# Patient Record
Sex: Male | Born: 1945
Health system: Southern US, Community
[De-identification: ages and names within clinical notes are randomized; demographics above are authoritative.]

## PROBLEM LIST (undated history)

## (undated) DIAGNOSIS — M8560 Other cyst of bone, unspecified site: Secondary | ICD-10-CM

## (undated) DIAGNOSIS — T8859XA Other complications of anesthesia, initial encounter: Secondary | ICD-10-CM

## (undated) DIAGNOSIS — T4145XA Adverse effect of unspecified anesthetic, initial encounter: Secondary | ICD-10-CM

## (undated) DIAGNOSIS — Z9889 Other specified postprocedural states: Secondary | ICD-10-CM

## (undated) DIAGNOSIS — I219 Acute myocardial infarction, unspecified: Secondary | ICD-10-CM

## (undated) DIAGNOSIS — I209 Angina pectoris, unspecified: Secondary | ICD-10-CM

## (undated) DIAGNOSIS — R112 Nausea with vomiting, unspecified: Secondary | ICD-10-CM

## (undated) DIAGNOSIS — I639 Cerebral infarction, unspecified: Secondary | ICD-10-CM

## (undated) HISTORY — DX: Cerebral infarction, unspecified: I63.9

## (undated) HISTORY — PX: APPENDECTOMY: SHX54

## (undated) HISTORY — PX: TONSILLECTOMY: SUR1361

## (undated) HISTORY — DX: Acute myocardial infarction, unspecified: I21.9

## (undated) HISTORY — PX: CARDIAC CATHETERIZATION: SHX172

## (undated) HISTORY — PX: HERNIA REPAIR: SHX51

---
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
## 2006-05-12 ENCOUNTER — Emergency Department (HOSPITAL_COMMUNITY): Admission: EM | Admit: 2006-05-12 | Discharge: 2006-05-12 | Payer: Self-pay | Admitting: Emergency Medicine

## 2007-10-21 ENCOUNTER — Emergency Department (HOSPITAL_COMMUNITY): Admission: EM | Admit: 2007-10-21 | Discharge: 2007-10-22 | Payer: Self-pay | Admitting: Emergency Medicine

## 2007-10-24 ENCOUNTER — Emergency Department (HOSPITAL_COMMUNITY): Admission: EM | Admit: 2007-10-24 | Discharge: 2007-10-24 | Payer: Self-pay | Admitting: Emergency Medicine

## 2008-05-05 ENCOUNTER — Ambulatory Visit (HOSPITAL_BASED_OUTPATIENT_CLINIC_OR_DEPARTMENT_OTHER): Admission: RE | Admit: 2008-05-05 | Discharge: 2008-05-05 | Payer: Self-pay | Admitting: Urology

## 2008-10-11 ENCOUNTER — Emergency Department (HOSPITAL_COMMUNITY): Admission: EM | Admit: 2008-10-11 | Discharge: 2008-10-11 | Payer: Self-pay | Admitting: Emergency Medicine

## 2009-05-25 IMAGING — CR DG CHEST 2V
2 series · 2 of 2 positions shown · non-contrast
Comparison: 10/21/2007

CLINICAL DATA: Chest pain

CHEST - 2 VIEW

[w chest pa]
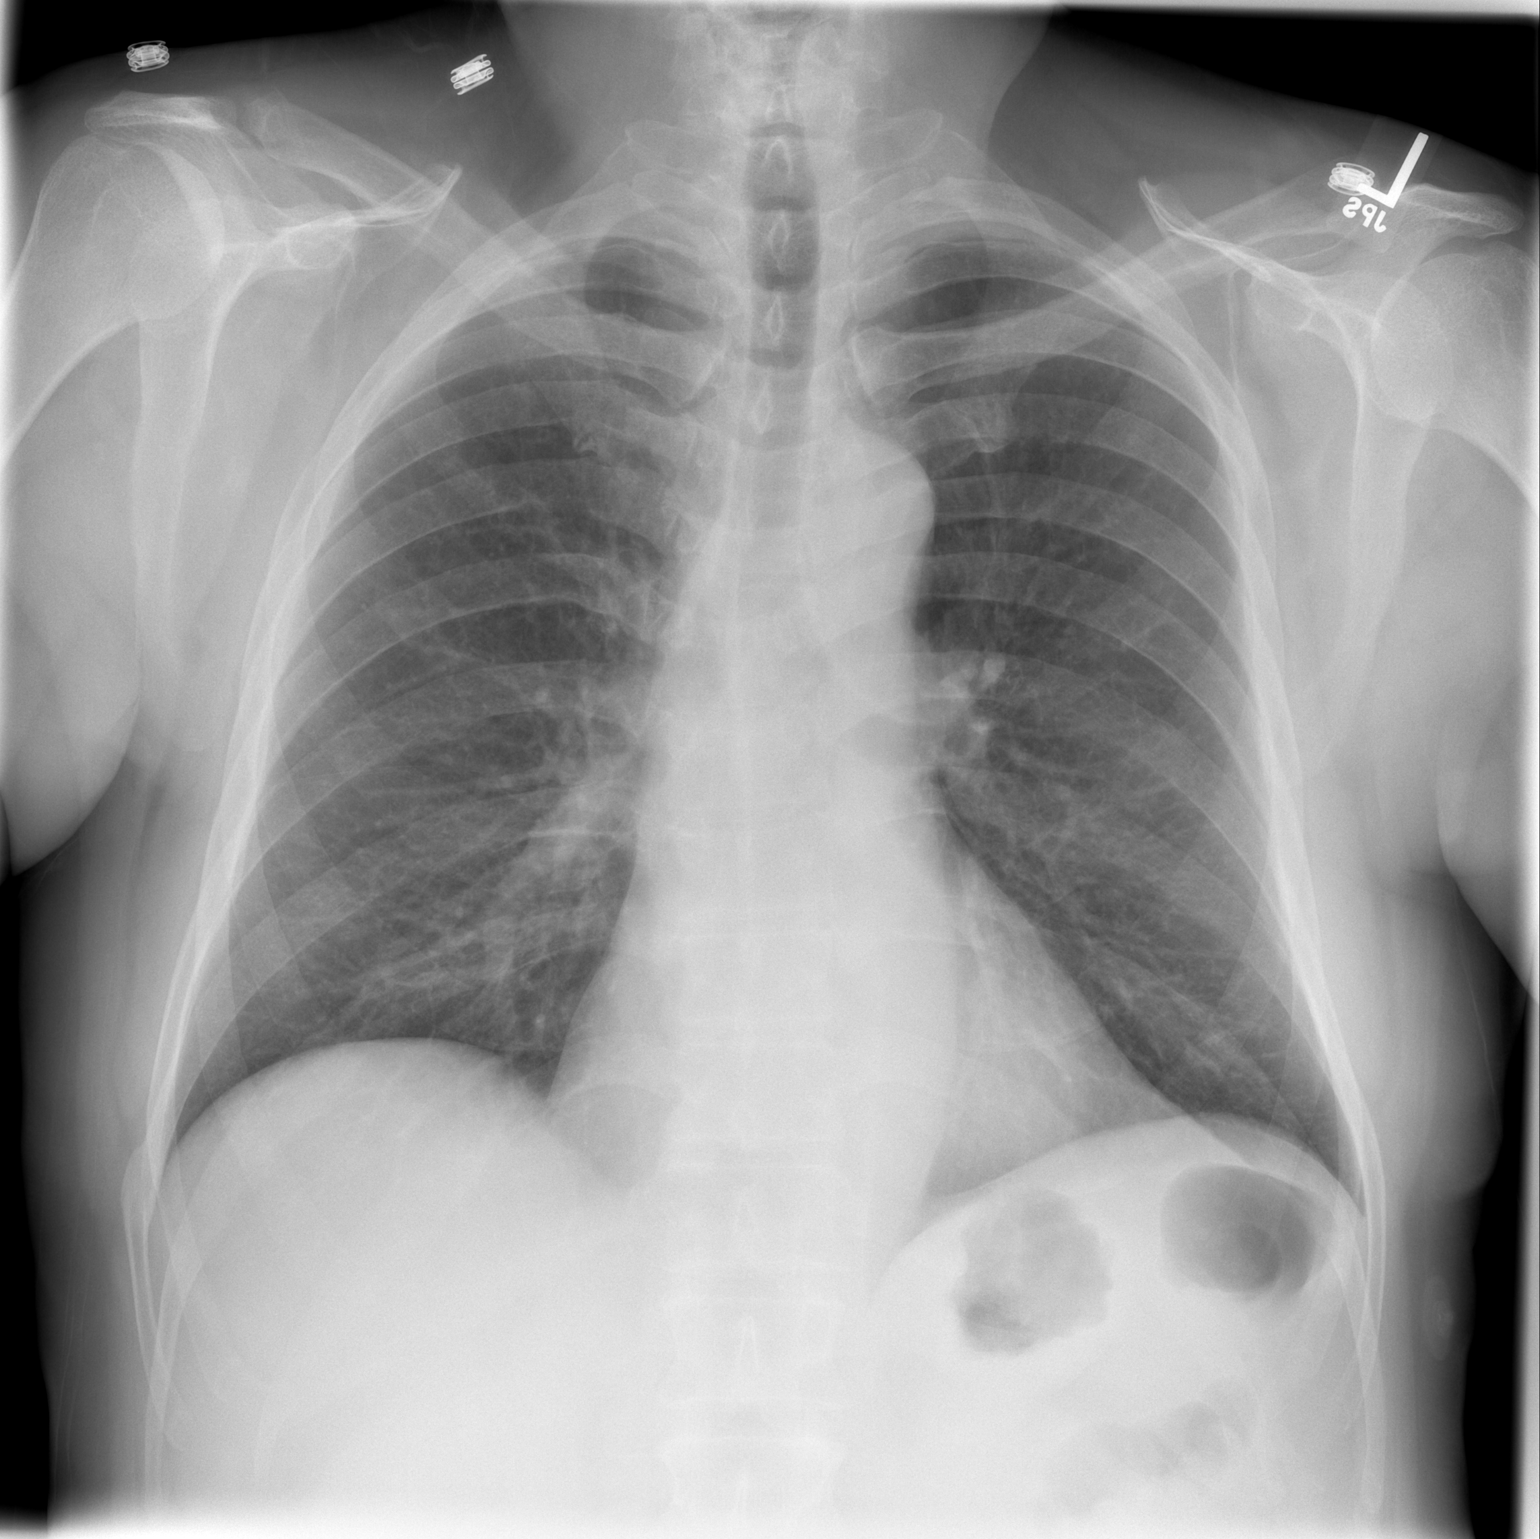

[w chest lat]
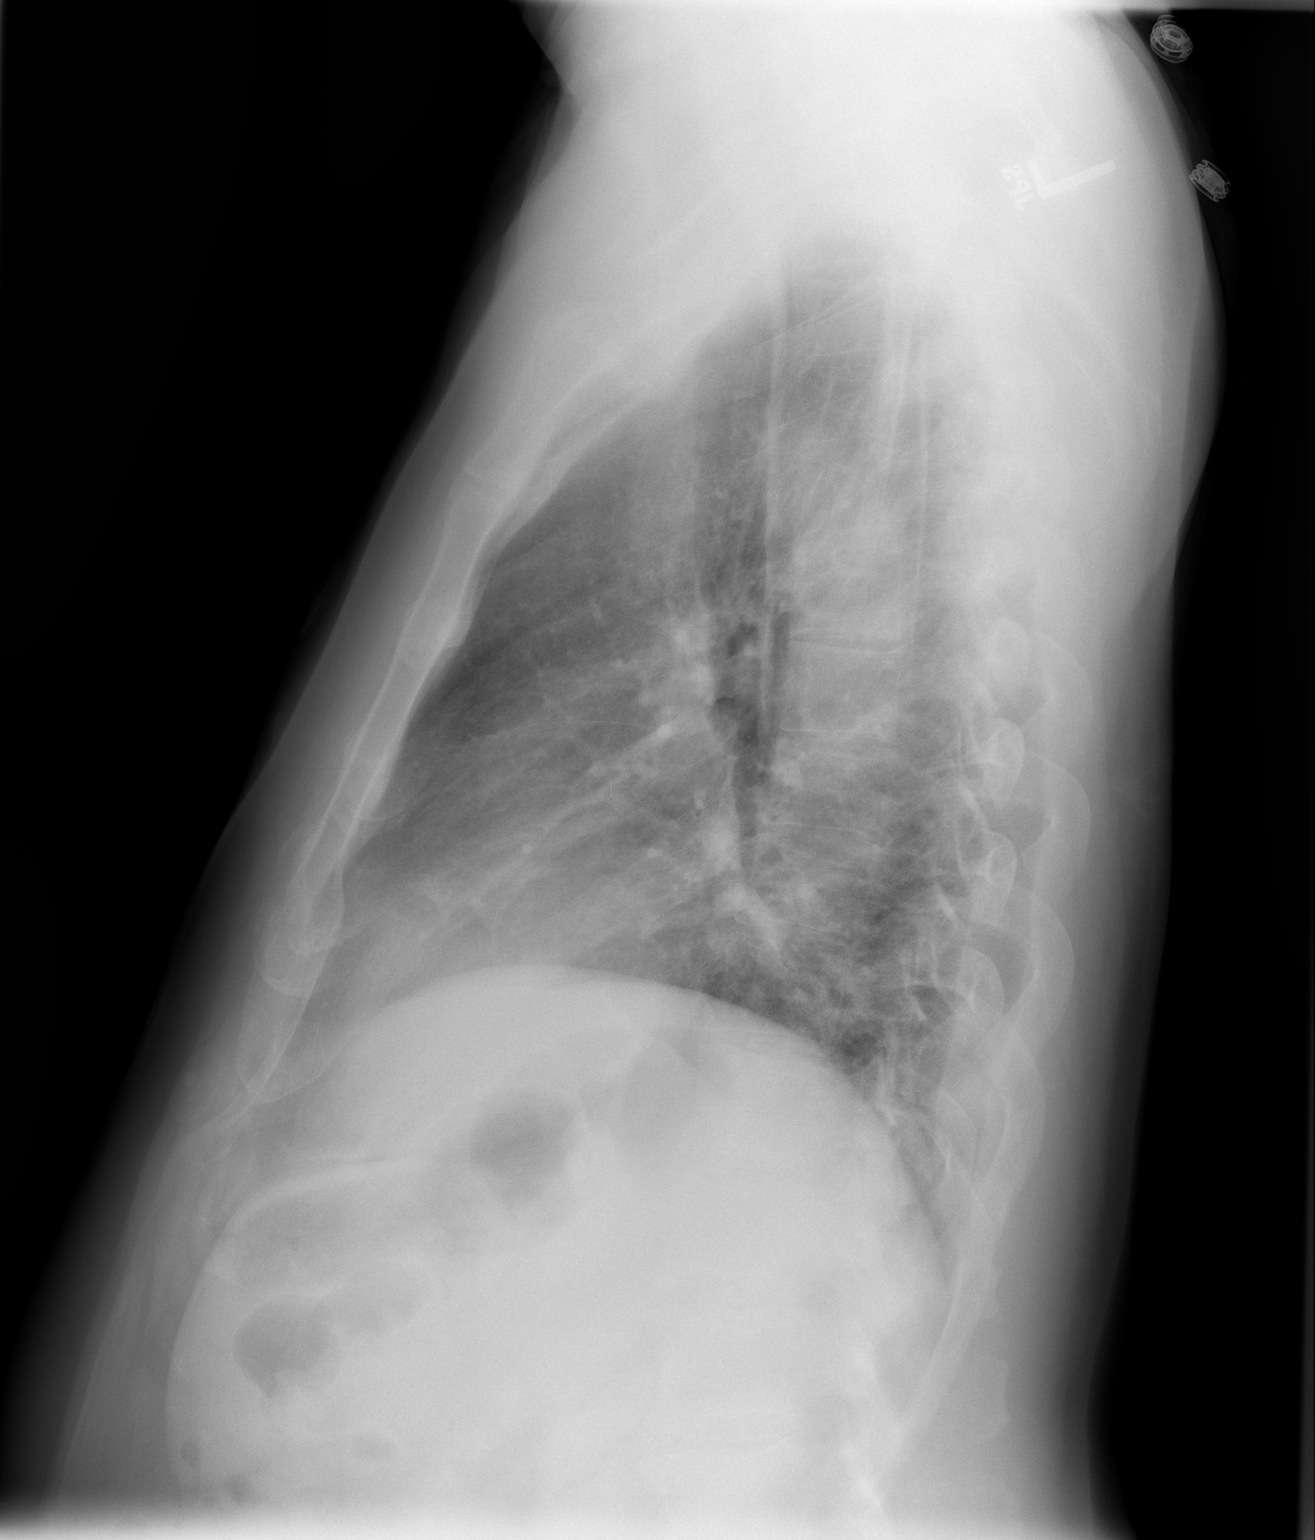

[2 of 2 positions shown; findings below may reference images not displayed]

FINDINGS: The heart size and mediastinal contours are within normal
limits.  Both lungs are clear.  Minimal chronic accentuation of
interstitial lung markings
IMPRESSION: No active disease.

## 2010-09-05 LAB — DIFFERENTIAL
Basophils Absolute: 0 10*3/uL (ref 0.0–0.1)
Basophils Relative: 0 % (ref 0–1)
Eosinophils Absolute: 0.2 10*3/uL (ref 0.0–0.7)
Monocytes Relative: 9 % (ref 3–12)
Neutro Abs: 3.8 10*3/uL (ref 1.7–7.7)
Neutrophils Relative %: 52 % (ref 43–77)

## 2010-09-05 LAB — POCT I-STAT, CHEM 8
Chloride: 103 mEq/L (ref 96–112)
Creatinine, Ser: 0.8 mg/dL (ref 0.4–1.5)
Glucose, Bld: 103 mg/dL — ABNORMAL HIGH (ref 70–99)
Sodium: 138 mEq/L (ref 135–145)

## 2010-09-05 LAB — POCT CARDIAC MARKERS
CKMB, poc: <1 ng/mL — ABNORMAL LOW (ref 1.0–8.0)
CKMB, poc: <1 ng/mL — ABNORMAL LOW (ref 1.0–8.0)
Myoglobin, poc: 30.6 ng/mL (ref 12–200)
Myoglobin, poc: 46.7 ng/mL (ref 12–200)
Troponin i, poc: <0.05 ng/mL (ref 0.00–0.09)
Troponin i, poc: <0.05 ng/mL (ref 0.00–0.09)

## 2010-09-05 LAB — CBC
MCHC: 35.3 g/dL (ref 30.0–36.0)
Platelets: 250 10*3/uL (ref 150–400)

## 2010-10-10 NOTE — Op Note (Signed)
NAMEBHAVYA, GRAND NO.:  [id number]   MEDICAL RECORD NO.:  [id number]           PATIENT TYPE:   LOCATION:                                 FACILITY:   PHYSICIAN:  Maretta Bees. Vonita Moss, M.D.DATE OF BIRTH:  Oct 06, 1945   DATE OF PROCEDURE:  05/05/2008  DATE OF DISCHARGE:                               OPERATIVE REPORT   PREOPERATIVE DIAGNOSIS:  Left hydrocele.   POSTOPERATIVE DIAGNOSIS:  Left hydrocele.   PROCEDURE:  Left hydrocelectomy.   SURGEON:  Larey Dresser, MD   ANESTHESIA:  General.   INDICATIONS:  This gentleman has had a long history of a slowly  enlarging left scrotal mass felt on exam to be a hydrocele and the  patient felt it was so big and uncomfortable it was time he wished it to  be removed.  After obtaining medical clearance he is brought to the OR  today.  He has been off his Plavix.   PROCEDURE IN DETAIL:  The patient was brought to the operating room and  placed in the supine position.  The lower abdomen and external genitalia  were prepped and draped in the usual fashion.  A transverse incision was  made in the left scrotum and carried down to the hydrocele which was  opened and drained of almost 500 mL of classic clear light yellow fluid.  Testicle was brought in the operative field and excess hydrocele sac  excised and bleeders fulgurated with the electrode.  Chromic catgut 3-0  was used to sew the remaining edge of the hydrocele behind the testicle  to prevent recurrence.  The cut edge of the hydrocele sac was oversewn  for hemostasis with 3-0 chromic catgut.  A few small bleeders were  electrocoagulated.  At this point there was good hemostasis and minimal  blood loss and this testicle was placed back in the scrotum.  The  scrotal wall closed in two layers of running 3-0 chromic catgut.  Some 5  mL of Marcaine was injected for local analgesia postoperatively.  Wound  was cleaned, dressed with Neosporin and dry sterile gauze.  He was  taken  to the recovery room in good condition having tolerated the procedure  well.      Maretta Bees. Vonita Moss, M.D.  Electronically Signed     LJP/MEDQ  D:  05/05/2008  T:  05/05/2008  Job:  [id number]   cc:   C. Duane Lope, M.D.  Fax: [phone]

## 2011-02-21 LAB — DIFFERENTIAL
Basophils Relative: 1
Monocytes Relative: 8
Neutro Abs: 8.4 — ABNORMAL HIGH
Neutrophils Relative %: 70

## 2011-02-21 LAB — POCT CARDIAC MARKERS
CKMB, poc: <1 — ABNORMAL LOW
CKMB, poc: <1 — ABNORMAL LOW
CKMB, poc: <1 — ABNORMAL LOW
Operator id: 229371
Operator id: 290111
Operator id: 290111
Troponin i, poc: <0.05
Troponin i, poc: <0.05

## 2011-02-21 LAB — COMPREHENSIVE METABOLIC PANEL
Albumin: 4.1
Alkaline Phosphatase: 99
BUN: 9
CO2: 23
Calcium: 9.5
GFR calc non Af Amer: >60
Total Bilirubin: 0.9

## 2011-02-21 LAB — CBC
Hemoglobin: 16.1
MCHC: 35.1
MCV: 93.2
Platelets: 280
WBC: 12.1 — ABNORMAL HIGH

## 2011-02-21 LAB — CK TOTAL AND CKMB (NOT AT ARMC): Relative Index: INVALID

## 2011-02-21 LAB — PROTIME-INR
INR: 0.9
Prothrombin Time: 12.3

## 2011-03-02 LAB — POCT HEMOGLOBIN-HEMACUE: Hemoglobin: 16.6 g/dL (ref 13.0–17.0)

## 2015-11-16 ENCOUNTER — Ambulatory Visit
Admission: RE | Admit: 2015-11-16 | Discharge: 2015-11-16 | Disposition: A | Discharge status: Home / Self Care | Payer: Medicare Other | Source: Ambulatory Visit | Attending: Family Medicine | Admitting: Family Medicine

## 2015-11-16 ENCOUNTER — Other Ambulatory Visit: Payer: Self-pay | Admitting: Family Medicine

## 2015-11-16 DIAGNOSIS — M79671 Pain in right foot: Secondary | ICD-10-CM

## 2015-12-02 ENCOUNTER — Encounter: Payer: Self-pay | Admitting: Podiatry

## 2015-12-02 ENCOUNTER — Ambulatory Visit (INDEPENDENT_AMBULATORY_CARE_PROVIDER_SITE_OTHER): Payer: Medicare Other

## 2015-12-02 ENCOUNTER — Ambulatory Visit (INDEPENDENT_AMBULATORY_CARE_PROVIDER_SITE_OTHER): Payer: Medicare Other | Admitting: Podiatry

## 2015-12-02 VITALS — BP 136/84 | HR 69 | Resp 12

## 2015-12-02 DIAGNOSIS — M722 Plantar fascial fibromatosis: Secondary | ICD-10-CM

## 2015-12-02 DIAGNOSIS — M79671 Pain in right foot: Secondary | ICD-10-CM

## 2015-12-02 MED ORDER — TRIAMCINOLONE ACETONIDE 10 MG/ML IJ SUSP
10.0000 mg | Freq: Once | INTRAMUSCULAR | Status: AC
  Administered 2015-12-02: 10 mg

## 2015-12-02 NOTE — Patient Instructions (Signed)
Plantar Fasciitis Plantar fasciitis is a painful foot condition that affects the heel. It occurs when the band of tissue that connects the toes to the heel bone (plantar fascia) becomes irritated. This can happen after exercising too much or doing other repetitive activities (overuse injury). The pain from plantar fasciitis can range from mild irritation to severe pain that makes it difficult for you to walk or move. The pain is usually worse in the morning or after you have been sitting or lying down for a while. CAUSES This condition may be caused by:  Standing for long periods of time.  Wearing shoes that do not fit.  Doing high-impact activities, including running, aerobics, and ballet.  Being overweight.  Having an abnormal way of walking (gait).  Having tight calf muscles.  Having high arches in your feet.  Starting a new athletic activity. SYMPTOMS The main symptom of this condition is heel pain. Other symptoms include:  Pain that gets worse after activity or exercise.  Pain that is worse in the morning or after resting.  Pain that goes away after you walk for a few minutes. DIAGNOSIS This condition may be diagnosed based on your signs and symptoms. Your health care provider will also do a physical exam to check for:  A tender area on the bottom of your foot.  A high arch in your foot.  Pain when you move your foot.  Difficulty moving your foot. You may also need to have imaging studies to confirm the diagnosis. These can include:  X-rays.  Ultrasound.  MRI. TREATMENT  Treatment for plantar fasciitis depends on the severity of the condition. Your treatment may include:  Rest, ice, and over-the-counter pain medicines to manage your pain.  Exercises to stretch your calves and your plantar fascia.  A splint that holds your foot in a stretched, upward position while you sleep (night splint).  Physical therapy to relieve symptoms and prevent problems in the  future.  Cortisone injections to relieve severe pain.  Extracorporeal shock wave therapy (ESWT) to stimulate damaged plantar fascia with electrical impulses. It is often used as a last resort before surgery.  Surgery, if other treatments have not worked after 12 months. HOME CARE INSTRUCTIONS  Take medicines only as directed by your health care provider.  Avoid activities that cause pain.  Roll the bottom of your foot over a bag of ice or a bottle of cold water. Do this for 20 minutes, 3-4 times a day.  Perform simple stretches as directed by your health care provider.  Try wearing athletic shoes with air-sole or gel-sole cushions or soft shoe inserts.  Wear a night splint while sleeping, if directed by your health care provider.  Keep all follow-up appointments with your health care provider. PREVENTION   Do not perform exercises or activities that cause heel pain.  Consider finding low-impact activities if you continue to have problems.  Lose weight if you need to. The best way to prevent plantar fasciitis is to avoid the activities that aggravate your plantar fascia. SEEK MEDICAL CARE IF:  Your symptoms do not go away after treatment with home care measures.  Your pain gets worse.  Your pain affects your ability to move or do your daily activities.   This information is not intended to replace advice given to you by your health care provider. Make sure you discuss any questions you have with your health care provider.   Document Released: 02/06/2001 Document Revised: 02/02/2015 Document Reviewed: 03/24/2014 Elsevier   Interactive Patient Education 2016 [employer] Inc.  

## 2015-12-02 NOTE — Progress Notes (Signed)
   Subjective:    Patient ID: Jerome Duncan, male    DOB: [birth year], 70 y.o.   MRN: [id number]  HPI  Chief Complaint  Patient presents with  . Plantar Fasciitis    RT FOOT BOTTOM OF THE HEEL IS BEEN HURTING FOR ABOUT 3 MONTHS. FOOT IS WORSE THE END OF THE PAIN.  TRIED ELEVATED/ NSAID -NO RELIEF   ALSO, CHECK 1ST JOINT IS PAINFUL ON THE LT FOOT.  Review of Systems  Musculoskeletal: Positive for gait problem.  All other systems reviewed and are negative.      Objective:   Physical Exam        Assessment & Plan:

## 2015-12-04 NOTE — Progress Notes (Signed)
Subjective:     Patient ID: Jerome Duncan, male   DOB: June 17, 1945, 70 y.o.   MRN: [id number]  HPI patient presents with a lot of pain in the bottom of his right heel and states that it's been going on now for about 3 months. Does not remember injury   Review of Systems  All other systems reviewed and are negative.      Objective:   Physical Exam  Constitutional: He is oriented to person, place, and time.  Cardiovascular: Intact distal pulses.   Musculoskeletal: Normal range of motion.  Neurological: He is oriented to person, place, and time.  Skin: Skin is warm.  Nursing note and vitals reviewed.  neurovascular status intact muscle strength adequate range of motion within normal limits with patient noted to have exquisite discomfort plantar aspect right heel at the insertional point tendon into the calcaneus with inflammation and fluid around the medial band and moderate depression of the arch. Patient's found have good digital perfusion and is well oriented 3     Assessment:     Inflammatory fasciitis right heel at the insertional point tendon into the calcaneus    Plan:     H&P x-rays reviewed condition discussed. Injected the right plantar fascia 3 mg Kenalog 5 mg Xylocaine and applied fascial brace instructed on physical therapy and shoe gear support and reappoint in 2 weeks  X-ray indicates spur formation with no indication of stress fracture or arthritis

## 2015-12-16 ENCOUNTER — Ambulatory Visit (INDEPENDENT_AMBULATORY_CARE_PROVIDER_SITE_OTHER): Payer: Medicare Other | Admitting: Podiatry

## 2015-12-16 ENCOUNTER — Encounter: Payer: Self-pay | Admitting: Podiatry

## 2015-12-16 DIAGNOSIS — M779 Enthesopathy, unspecified: Secondary | ICD-10-CM

## 2015-12-16 DIAGNOSIS — M722 Plantar fascial fibromatosis: Secondary | ICD-10-CM | POA: Diagnosis not present

## 2015-12-21 NOTE — Progress Notes (Signed)
Subjective:     Patient ID: Jerome Duncan, male   DOB: May 18, 1946, 70 y.o.   MRN: [id number]  HPI patient states that the heel has improved quite a bit with mild discomfort with palpation   Review of Systems     Objective:   Physical Exam Neurovascular status intact muscle strength adequate range of motion within normal limits with patient noted to have improved right hallux with inflammation fluid buildup that has reduced quite significantly    Assessment:     Improved plantar fasciitis right with pain still present upon deep palpation    Plan:     Advised on physical therapy anti-inflammatories and continued supportive therapy. Reappoint as needed

## 2018-10-01 ENCOUNTER — Ambulatory Visit: Payer: Medicare Other | Admitting: Podiatry

## 2018-10-01 ENCOUNTER — Other Ambulatory Visit: Payer: Self-pay

## 2018-10-01 ENCOUNTER — Other Ambulatory Visit: Payer: Self-pay | Admitting: Podiatry

## 2018-10-01 ENCOUNTER — Ambulatory Visit (INDEPENDENT_AMBULATORY_CARE_PROVIDER_SITE_OTHER): Payer: Medicare Other

## 2018-10-01 ENCOUNTER — Encounter: Payer: Self-pay | Admitting: Podiatry

## 2018-10-01 VITALS — Temp 97.5°F

## 2018-10-01 DIAGNOSIS — M79672 Pain in left foot: Secondary | ICD-10-CM

## 2018-10-01 DIAGNOSIS — M722 Plantar fascial fibromatosis: Secondary | ICD-10-CM

## 2018-10-01 MED ORDER — TRIAMCINOLONE ACETONIDE 10 MG/ML IJ SUSP
10.0000 mg | Freq: Once | INTRAMUSCULAR | Status: AC
  Administered 2018-10-01: 10 mg

## 2018-10-01 NOTE — Patient Instructions (Signed)

## 2018-10-01 NOTE — Progress Notes (Signed)
Subjective:   Patient ID: Jerome Duncan, male   DOB: 73 y.o.   MRN: [id number]   HPI Patient has started to develop a lot of pain in the bottom of his left heel and states that it started over the last several weeks and worse over the last 2 weeks.  Does not remember specific injury but has been active and patient does not currently smoke   ROS      Objective:  Physical Exam  Neurovascular status intact with patient found to have exquisite discomfort left plantar heel at the insertional point of the tendon into the calcaneus with inflammation fluid around the medial band     Assessment:  Acute plantar fasciitis left inflammation fluid around the medial band     Plan:  H&P x-ray reviewed and sterile prep applied and injected the left plantar fascia 3 mg Kenalog 5 mg Xylocaine applied fascial brace with instructions on usage and reappoint to recheck  X-rays indicate spur with no indication stress fracture arthritis

## 2018-10-15 ENCOUNTER — Encounter: Payer: Self-pay | Admitting: Podiatry

## 2018-10-15 ENCOUNTER — Other Ambulatory Visit: Payer: Self-pay

## 2018-10-15 ENCOUNTER — Ambulatory Visit: Payer: Medicare Other | Admitting: Podiatry

## 2018-10-15 VITALS — Temp 97.7°F

## 2018-10-15 DIAGNOSIS — M722 Plantar fascial fibromatosis: Secondary | ICD-10-CM

## 2018-10-15 MED ORDER — TRIAMCINOLONE ACETONIDE 10 MG/ML IJ SUSP
10.0000 mg | Freq: Once | INTRAMUSCULAR | Status: AC
  Administered 2018-10-15: 10 mg

## 2018-10-15 NOTE — Progress Notes (Signed)
Subjective:   Patient ID: Jerome Duncan, male   DOB: 73 y.o.   MRN: [id number]   HPI Patient states still developing pain but improved from previously with discomfort still if I walk on it for an extended period of time   ROS      Objective:  Physical Exam  Neurovascular status intact with inflammation pain around the plantar heel left at the insertional point tendon calcaneus     Assessment:  Plantar fasciitis present left still at the insertional point calcaneus with moderate improvement     Plan:  Reviewed the importance of exercises reviewed the importance of continued shoe gear modification and sterile prep and reinjected the fascia at insertion 3 mg Kenalog 5 mg Xylocaine and advised if symptoms recur will be seen back again

## 2019-06-26 ENCOUNTER — Ambulatory Visit: Payer: Medicare Other

## 2019-07-01 ENCOUNTER — Ambulatory Visit: Payer: Medicare Other

## 2019-07-04 ENCOUNTER — Ambulatory Visit: Payer: Medicare Other | Attending: Internal Medicine

## 2019-07-04 DIAGNOSIS — Z23 Encounter for immunization: Secondary | ICD-10-CM

## 2019-07-04 NOTE — Progress Notes (Signed)
   Covid-19 Vaccination Clinic  Name:  Jerome Duncan    MRN: [id number] DOB: 1946/03/05  07/04/2019  Mr. Jerome Duncan was observed post Covid-19 immunization for 15 minutes without incidence. He was provided with Vaccine Information Sheet and instruction to access the V-Safe system.   Mr. Jerome Duncan was instructed to call 911 with any severe reactions post vaccine: Marland Kitchen Difficulty breathing  . Swelling of your face and throat  . A fast heartbeat  . A bad rash all over your body  . Dizziness and weakness    Immunizations Administered    Name Date Dose VIS Date Route   Pfizer COVID-19 Vaccine 07/04/2019  4:01 PM 0.3 mL 05/08/2019 Intramuscular   Manufacturer: [place]   Lot: [id number]   [place]: [id number]

## 2019-07-29 ENCOUNTER — Ambulatory Visit: Payer: Medicare Other | Attending: Internal Medicine

## 2019-07-29 DIAGNOSIS — Z23 Encounter for immunization: Secondary | ICD-10-CM

## 2019-07-29 NOTE — Progress Notes (Signed)
   Covid-19 Vaccination Clinic  Name:  Jerome Duncan    MRN: [id number] DOB: 1945/11/20  07/29/2019  [name] was observed post Covid-19 immunization for 15 minutes without incident. He was provided with Vaccine Information Sheet and instruction to access the V-Safe system.   [name] was instructed to call 911 with any severe reactions post vaccine: Marland Kitchen Difficulty breathing  . Swelling of face and throat  . A fast heartbeat  . A bad rash all over body  . Dizziness and weakness   Immunizations Administered    Name Date Dose VIS Date Route   Pfizer COVID-19 Vaccine 07/29/2019 12:04 PM 0.3 mL 05/08/2019 Intramuscular   Manufacturer: [place]   Lot: [id number]   [place]: [id number]

## 2019-09-25 ENCOUNTER — Other Ambulatory Visit (HOSPITAL_COMMUNITY)
Admission: RE | Admit: 2019-09-25 | Discharge: 2019-09-25 | Disposition: A | Discharge status: Home / Self Care | Payer: Medicare Other | Source: Ambulatory Visit | Attending: Orthopedic Surgery | Admitting: Orthopedic Surgery

## 2019-09-25 DIAGNOSIS — Z01812 Encounter for preprocedural laboratory examination: Secondary | ICD-10-CM | POA: Insufficient documentation

## 2019-09-25 DIAGNOSIS — Z20822 Contact with and (suspected) exposure to covid-19: Secondary | ICD-10-CM | POA: Insufficient documentation

## 2019-09-25 LAB — SARS CORONAVIRUS 2 (TAT 6-24 HRS): SARS Coronavirus 2: NEGATIVE

## 2019-09-25 NOTE — Patient Instructions (Addendum)
DUE TO COVID-19 ONLY ONE VISITOR IS ALLOWED TO COME WITH YOU AND STAY IN THE WAITING ROOM ONLY DURING PRE OP AND PROCEDURE DAY OF SURGERY. THE 2 VISITORS MAY VISIT WITH YOU AFTER SURGERY IN YOUR PRIVATE ROOM DURING VISITING HOURS ONLY!   ONCE YOUR COVID TEST IS COMPLETED, PLEASE BEGIN THE QUARANTINE INSTRUCTIONS AS OUTLINED IN YOUR HANDOUT.                Jerome [facility]    Your procedure is scheduled on: 09/29/19   Report to [facility] Main  Entrance   Report to Short Stay at 5:30 AM     Call this number if you have problems the morning of surgery Jerome Duncan, NO CHEWING GUM [place].  Do not eat food After Midnight.   YOU MAY HAVE CLEAR LIQUIDS FROM MIDNIGHT UNTIL 4:30AM  . At 4:30AM Please finish the prescribed Pre-Surgery  drink.   Nothing by mouth after you finish the  drink !    Take these medicines the morning of surgery with A SIP OF WATER: None               You may not have any metal on your body including               piercings  Do not wear jewelry,  lotions, powders or  deodorant                      Men may shave face and neck.   Do not bring valuables to the hospital. [facility].  Contacts, dentures or bridgework may not be worn into surgery.       Patients discharged the day of surgery will not be allowed to drive home. IF YOU ARE HAVING SURGERY AND GOING HOME THE SAME DAY, YOU MUST HAVE AN ADULT TO DRIVE YOU HOME AND BE WITH YOU FOR 24 HOURS. YOU MAY GO HOME BY TAXI OR UBER OR ORTHERWISE, BUT AN ADULT MUST ACCOMPANY YOU HOME AND STAY WITH YOU FOR 24 HOURS.  Name and phone number of your driver:  Special Instructions: N/A              Please read over the following fact sheets you were given: _____________________________________________________________________  [facility]- Preparing for Total Shoulder Arthroplasty    Before  surgery, you can play an important role. Because skin is not sterile, your skin needs to be as free of germs as possible. You can reduce the number of germs on your skin by using the following products. . Benzoyl Peroxide Gel o Reduces the number of germs present on the skin o Applied twice a day to shoulder area starting two days before surgery    ==================================================================  Please follow these instructions carefully:  BENZOYL PEROXIDE 5% GEL  Please do not use if you have an allergy to benzoyl peroxide.   If your skin becomes reddened/irritated stop using the benzoyl peroxide.  Starting two days before surgery, apply as follows: 1. Apply benzoyl peroxide in the morning and at night. Apply after taking a shower. If you are not taking a shower clean entire shoulder front, back, and side along with the armpit with a clean wet washcloth.  2. Place a quarter-sized dollop on your shoulder and rub  in thoroughly, making sure to cover the front, back, and side of your shoulder, along with the armpit.   2 days before ____ AM   ____ PM              1 day before ____ AM   ____ PM                         3. Do this twice a day for two days.  (Last application is the night before surgery, AFTER using the CHG soap as described below).  4. Do NOT apply benzoyl peroxide gel on the day of surgery.            [facility] - Preparing for Surgery Before surgery, you can play an important role.   Because skin is not sterile, your skin needs to be as free of germs as possible.   You can reduce the number of germs on your skin by washing with CHG (chlorahexidine gluconate) soap before surgery.   CHG is an antiseptic cleaner which kills germs and bonds with the skin to continue killing germs even after washing. Please DO NOT use if you have an allergy to CHG or antibacterial soaps.   If your skin becomes reddened/irritated stop using the CHG and inform your nurse when  you arrive at Short Stay.  You may shave your face/neck.  Please follow these instructions carefully:  1.  Shower with CHG Soap the night before surgery and the  morning of Surgery.  2.  If you choose to wash your hair, wash your hair first as usual with your  normal  shampoo.  3.  After you shampoo, rinse your hair and body thoroughly to remove the  shampoo.                                        4.  Use CHG as you would any other liquid soap.  You can apply chg directly  to the skin and wash                       Gently with a scrungie or clean washcloth.  5.  Apply the CHG Soap to your body ONLY FROM THE NECK DOWN.   Do not use on face/ open                           Wound or open sores. Avoid contact with eyes, ears mouth and genitals (private parts).                       Wash face,  Genitals (private parts) with your normal soap.             6.  Wash thoroughly, paying special attention to the area where your surgery  will be performed.  7.  Thoroughly rinse your body with warm water from the neck down.  8.  DO NOT shower/wash with your normal soap after using and rinsing off  the CHG Soap.             9.  Pat yourself dry with a clean towel.            10.  Wear clean pajamas.            11.  Place clean sheets on your bed the night of your first shower and do not  sleep with pets. Day of Surgery : Do not apply any lotions/deodorants the morning of surgery.  Please wear clean clothes to the hospital/surgery center.  FAILURE TO FOLLOW THESE INSTRUCTIONS MAY RESULT IN THE CANCELLATION OF YOUR SURGERY PATIENT SIGNATURE_________________________________  NURSE SIGNATURE__________________________________  ________________________________________________________________________   [facility]  An incentive spirometer is a tool that can help keep your lungs clear and active. This tool measures how well you are filling your lungs with each breath. Taking long deep breaths may  help reverse or decrease the chance of developing breathing (pulmonary) problems (especially infection) following:  A long period of time when you are unable to move or be active. BEFORE THE PROCEDURE   If the spirometer includes an indicator to show your best effort, your nurse or respiratory therapist will set it to a desired goal.  If possible, sit up straight or lean slightly forward. Try not to slouch.  Hold the incentive spirometer in an upright position. INSTRUCTIONS FOR USE  1. Sit on the edge of your bed if possible, or sit up as far as you can in bed or on a chair. 2. Hold the incentive spirometer in an upright position. 3. Breathe out normally. 4. Place the mouthpiece in your mouth and seal your lips tightly around it. 5. Breathe in slowly and as deeply as possible, raising the piston or the ball toward the top of the column. 6. Hold your breath for 3-5 seconds or for as long as possible. Allow the piston or ball to fall to the bottom of the column. 7. Remove the mouthpiece from your mouth and breathe out normally. 8. Rest for a few seconds and repeat Steps 1 through 7 at least 10 times every 1-2 hours when you are awake. Take your time and take a few normal breaths between deep breaths. 9. The spirometer may include an indicator to show your best effort. Use the indicator as a goal to work toward during each repetition. 10. After each set of 10 deep breaths, practice coughing to be sure your lungs are clear. If you have an incision (the cut made at the time of surgery), support your incision when coughing by placing a pillow or rolled up towels firmly against it. Once you are able to get out of bed, walk around indoors and cough well. You may stop using the incentive spirometer when instructed by your caregiver.  RISKS AND COMPLICATIONS  Take your time so you do not get dizzy or light-headed.  If you are in pain, you may need to take or ask for pain medication before doing  incentive spirometry. It is harder to take a deep breath if you are having pain. AFTER USE  Rest and breathe slowly and easily.  It can be helpful to keep track of a log of your progress. Your caregiver can provide you with a simple table to help with this. If you are using the spirometer at home, follow these instructions: [place] IF:   You are having difficultly using the spirometer.  You have trouble using the spirometer as often as instructed.  Your pain medication is not giving enough relief while using the spirometer.  You develop fever of 100.5 F (38.1 C) or higher. SEEK IMMEDIATE MEDICAL CARE IF:   You cough up bloody sputum that had not been present before.  You develop fever of 102 F (38.9 C) or greater.  You develop worsening pain at or near the incision site. MAKE SURE YOU:   Understand these instructions.  Will watch your condition.  Will get help right away if you are not doing well or get worse. Document Released: 09/24/2006 Document Revised: 08/06/2011 Document Reviewed: 11/25/2006 [facility] Patient Information 2014 [place], [place].   ________________________________________________________________________

## 2019-09-28 ENCOUNTER — Encounter (HOSPITAL_COMMUNITY)
Admission: RE | Admit: 2019-09-28 | Discharge: 2019-09-28 | Disposition: A | Discharge status: Home / Self Care | Payer: Medicare Other | Source: Ambulatory Visit | Attending: Orthopedic Surgery | Admitting: Orthopedic Surgery

## 2019-09-28 ENCOUNTER — Encounter (HOSPITAL_COMMUNITY): Payer: Self-pay

## 2019-09-28 ENCOUNTER — Other Ambulatory Visit: Payer: Self-pay

## 2019-09-28 DIAGNOSIS — Z01810 Encounter for preprocedural cardiovascular examination: Secondary | ICD-10-CM | POA: Diagnosis not present

## 2019-09-28 DIAGNOSIS — R9431 Abnormal electrocardiogram [ECG] [EKG]: Secondary | ICD-10-CM | POA: Insufficient documentation

## 2019-09-28 DIAGNOSIS — Z01812 Encounter for preprocedural laboratory examination: Secondary | ICD-10-CM | POA: Diagnosis not present

## 2019-09-28 HISTORY — DX: Angina pectoris, unspecified: I20.9

## 2019-09-28 HISTORY — DX: Other specified postprocedural states: Z98.890

## 2019-09-28 HISTORY — DX: Other complications of anesthesia, initial encounter: T88.59XA

## 2019-09-28 HISTORY — DX: Nausea with vomiting, unspecified: R11.2

## 2019-09-28 HISTORY — DX: Other cyst of bone, unspecified site: M85.60

## 2019-09-28 LAB — CBC
HCT: 40.2 % (ref 39.0–52.0)
Hemoglobin: 13.7 g/dL (ref 13.0–17.0)
MCH: 32.9 pg (ref 26.0–34.0)
MCHC: 34.1 g/dL (ref 30.0–36.0)
MCV: 96.4 fL (ref 80.0–100.0)
Platelets: 264 10*3/uL (ref 150–400)
RBC: 4.17 MIL/uL — ABNORMAL LOW (ref 4.22–5.81)
RDW: 13.3 % (ref 11.5–15.5)
WBC: 7.9 10*3/uL (ref 4.0–10.5)
nRBC: 0 % (ref 0.0–0.2)

## 2019-09-28 LAB — SURGICAL PCR SCREEN
MRSA, PCR: NEGATIVE
Staphylococcus aureus: NEGATIVE

## 2019-09-28 NOTE — Progress Notes (Signed)
PCP - Dr. Tommie Ard Cardiologist - none now  Chest x-ray - no EKG - no Stress Test - no ECHO - no Cardiac Cath - yes 21 years ago  Sleep Study - no CPAP -   Fasting Blood Sugar - NA Checks Blood Sugar _____ times a day  Blood Thinner Instructions:NA Aspirin Instructions: Last Dose:  Anesthesia review:   Patient denies shortness of breath, fever, cough and chest pain at PAT appointment yes  Patient verbalized understanding of instructions that were given to them at the PAT appointment. Patient was also instructed that they will need to review over the PAT instructions again at home before surgery. yes

## 2019-09-28 NOTE — Anesthesia Preprocedure Evaluation (Addendum)
Anesthesia Evaluation  Patient identified by MRN, date of birth, ID band Patient awake    Reviewed: Allergy & Precautions, NPO status , Patient's Chart, lab work & pertinent test results  History of Anesthesia Complications (+) PONV and history of anesthetic complications  Airway Mallampati: III  TM Distance: >3 FB Neck ROM: Full    Dental  (+) Dental Advisory Given, Teeth Intact   Pulmonary former smoker,    Pulmonary exam normal        Cardiovascular (-) angina+ Past MI  Normal cardiovascular exam     Neuro/Psych CVA, No Residual Symptoms negative psych ROS   GI/Hepatic negative GI ROS, Neg liver ROS,   Endo/Other  negative endocrine ROS  Renal/GU negative Renal ROS     Musculoskeletal  (+) Arthritis ,   Abdominal   Peds  Hematology negative hematology ROS (+)   Anesthesia Other Findings Covid neg 4/30   Reproductive/Obstetrics                            Anesthesia Physical Anesthesia Plan  ASA: III  Anesthesia Plan: General   Post-op Pain Management:  Regional for Post-op pain   Induction: Intravenous  PONV Risk Score and Plan: 3 and Treatment may vary due to age or medical condition, Ondansetron and Dexamethasone  Airway Management Planned: Oral ETT  Additional Equipment: None  Intra-op Plan:   Post-operative Plan: Extubation in OR  Informed Consent: I have reviewed the patients History and Physical, chart, labs and discussed the procedure including the risks, benefits and alternatives for the proposed anesthesia with the patient or authorized representative who has indicated his/her understanding and acceptance.     Dental advisory given  Plan Discussed with: CRNA and Anesthesiologist  Anesthesia Plan Comments:        Anesthesia Quick Evaluation

## 2019-09-29 ENCOUNTER — Other Ambulatory Visit: Payer: Self-pay

## 2019-09-29 ENCOUNTER — Ambulatory Visit (HOSPITAL_COMMUNITY): Payer: Medicare Other | Admitting: Anesthesiology

## 2019-09-29 ENCOUNTER — Ambulatory Visit (HOSPITAL_COMMUNITY): Payer: Medicare Other | Admitting: Physician Assistant

## 2019-09-29 ENCOUNTER — Encounter (HOSPITAL_COMMUNITY): Payer: Self-pay | Admitting: Orthopedic Surgery

## 2019-09-29 ENCOUNTER — Ambulatory Visit (HOSPITAL_COMMUNITY)
Admission: RE | Admit: 2019-09-29 | Discharge: 2019-09-30 | Disposition: A | Discharge status: Home / Self Care | Payer: Medicare Other | Attending: Orthopedic Surgery | Admitting: Orthopedic Surgery

## 2019-09-29 ENCOUNTER — Encounter (HOSPITAL_COMMUNITY)
Admission: RE | Disposition: A | Discharge status: Home / Self Care | Payer: Self-pay | Source: Home / Self Care | Attending: Orthopedic Surgery

## 2019-09-29 DIAGNOSIS — I252 Old myocardial infarction: Secondary | ICD-10-CM | POA: Insufficient documentation

## 2019-09-29 DIAGNOSIS — Z96611 Presence of right artificial shoulder joint: Secondary | ICD-10-CM | POA: Diagnosis present

## 2019-09-29 DIAGNOSIS — Z8673 Personal history of transient ischemic attack (TIA), and cerebral infarction without residual deficits: Secondary | ICD-10-CM | POA: Diagnosis not present

## 2019-09-29 DIAGNOSIS — M75101 Unspecified rotator cuff tear or rupture of right shoulder, not specified as traumatic: Secondary | ICD-10-CM | POA: Insufficient documentation

## 2019-09-29 DIAGNOSIS — Z87891 Personal history of nicotine dependence: Secondary | ICD-10-CM | POA: Insufficient documentation

## 2019-09-29 HISTORY — PX: REVERSE SHOULDER ARTHROPLASTY: SHX5054

## 2019-09-29 SURGERY — ARTHROPLASTY, SHOULDER, TOTAL, REVERSE
Anesthesia: General | Site: Shoulder | Laterality: Right

## 2019-09-29 MED ORDER — FENTANYL CITRATE (PF) 250 MCG/5ML IJ SOLN
INTRAMUSCULAR | Status: AC
  Filled 2019-09-29: qty 5

## 2019-09-29 MED ORDER — DOCUSATE SODIUM 100 MG PO CAPS
100.0000 mg | ORAL_CAPSULE | Freq: Two times a day (BID) | ORAL | Status: DC
  Administered 2019-09-29 – 2019-09-30 (×3): 100 mg via ORAL
  Filled 2019-09-29 (×3): qty 1

## 2019-09-29 MED ORDER — LIDOCAINE 2% (20 MG/ML) 5 ML SYRINGE
INTRAMUSCULAR | Status: DC | PRN
  Administered 2019-09-29: 60 mg via INTRAVENOUS

## 2019-09-29 MED ORDER — ROCURONIUM BROMIDE 10 MG/ML (PF) SYRINGE
PREFILLED_SYRINGE | INTRAVENOUS | Status: DC | PRN
  Administered 2019-09-29: 50 mg via INTRAVENOUS

## 2019-09-29 MED ORDER — BUPIVACAINE LIPOSOME 1.3 % IJ SUSP
INTRAMUSCULAR | Status: DC | PRN
Start: 2019-09-29 — End: 2019-09-29
  Administered 2019-09-29: 10 mL via PERINEURAL

## 2019-09-29 MED ORDER — PANTOPRAZOLE SODIUM 40 MG PO TBEC
40.0000 mg | DELAYED_RELEASE_TABLET | Freq: Every day | ORAL | Status: DC
  Administered 2019-09-29 – 2019-09-30 (×2): 40 mg via ORAL
  Filled 2019-09-29 (×2): qty 1

## 2019-09-29 MED ORDER — BUPIVACAINE HCL 0.5 % IJ SOLN
INTRAMUSCULAR | Status: DC | PRN
Start: 2019-09-29 — End: 2019-09-29
  Administered 2019-09-29: 15 mL

## 2019-09-29 MED ORDER — KETOROLAC TROMETHAMINE 15 MG/ML IJ SOLN
7.5000 mg | Freq: Four times a day (QID) | INTRAMUSCULAR | Status: AC
  Administered 2019-09-29 – 2019-09-30 (×4): 7.5 mg via INTRAVENOUS
  Filled 2019-09-29 (×4): qty 1

## 2019-09-29 MED ORDER — METHOCARBAMOL 500 MG IVPB - SIMPLE MED
500.0000 mg | Freq: Four times a day (QID) | INTRAVENOUS | Status: DC | PRN
  Filled 2019-09-29: qty 50

## 2019-09-29 MED ORDER — ALUM & MAG HYDROXIDE-SIMETH 200-200-20 MG/5ML PO SUSP: 30.0000 mL | ORAL | Status: DC | PRN

## 2019-09-29 MED ORDER — ACETAMINOPHEN 325 MG PO TABS
325.0000 mg | ORAL_TABLET | Freq: Four times a day (QID) | ORAL | Status: DC | PRN
  Administered 2019-09-29: 325 mg via ORAL
  Filled 2019-09-29: qty 2

## 2019-09-29 MED ORDER — PHENOL 1.4 % MT LIQD
1.0000 | OROMUCOSAL | Status: DC | PRN
  Filled 2019-09-29: qty 177

## 2019-09-29 MED ORDER — ONDANSETRON HCL 4 MG/2ML IJ SOLN
INTRAMUSCULAR | Status: DC | PRN
  Administered 2019-09-29: 4 mg via INTRAVENOUS

## 2019-09-29 MED ORDER — BISACODYL 5 MG PO TBEC: 5.0000 mg | DELAYED_RELEASE_TABLET | Freq: Every day | ORAL | Status: DC | PRN

## 2019-09-29 MED ORDER — METOCLOPRAMIDE HCL 5 MG/ML IJ SOLN: 5.0000 mg | Freq: Three times a day (TID) | INTRAMUSCULAR | Status: DC | PRN

## 2019-09-29 MED ORDER — PROPOFOL 10 MG/ML IV BOLUS
INTRAVENOUS | Status: AC
  Filled 2019-09-29: qty 20

## 2019-09-29 MED ORDER — FENTANYL CITRATE (PF) 100 MCG/2ML IJ SOLN
INTRAMUSCULAR | Status: DC | PRN
  Administered 2019-09-29 (×3): 50 ug via INTRAVENOUS

## 2019-09-29 MED ORDER — METOCLOPRAMIDE HCL 5 MG PO TABS: 5.0000 mg | ORAL_TABLET | Freq: Three times a day (TID) | ORAL | Status: DC | PRN

## 2019-09-29 MED ORDER — ONDANSETRON HCL 4 MG/2ML IJ SOLN: 4.0000 mg | Freq: Four times a day (QID) | INTRAMUSCULAR | Status: DC | PRN

## 2019-09-29 MED ORDER — HYDROMORPHONE HCL 1 MG/ML IJ SOLN: 0.5000 mg | INTRAMUSCULAR | Status: DC | PRN

## 2019-09-29 MED ORDER — POLYETHYLENE GLYCOL 3350 17 G PO PACK: 17.0000 g | PACK | Freq: Every day | ORAL | Status: DC | PRN

## 2019-09-29 MED ORDER — ONDANSETRON HCL 4 MG/2ML IJ SOLN: 4.0000 mg | Freq: Once | INTRAMUSCULAR | Status: DC | PRN

## 2019-09-29 MED ORDER — OXYCODONE HCL 5 MG PO TABS: 5.0000 mg | ORAL_TABLET | Freq: Once | ORAL | Status: DC | PRN

## 2019-09-29 MED ORDER — METHOCARBAMOL 500 MG PO TABS: 500.0000 mg | ORAL_TABLET | Freq: Four times a day (QID) | ORAL | Status: DC | PRN

## 2019-09-29 MED ORDER — DEXAMETHASONE SODIUM PHOSPHATE 10 MG/ML IJ SOLN
INTRAMUSCULAR | Status: DC | PRN
  Administered 2019-09-29: 10 mg via INTRAVENOUS

## 2019-09-29 MED ORDER — EPHEDRINE SULFATE-NACL 50-0.9 MG/10ML-% IV SOSY
PREFILLED_SYRINGE | INTRAVENOUS | Status: DC | PRN
  Administered 2019-09-29 (×4): 10 mg via INTRAVENOUS

## 2019-09-29 MED ORDER — DIPHENHYDRAMINE HCL 12.5 MG/5ML PO ELIX
12.5000 mg | ORAL_SOLUTION | ORAL | Status: DC | PRN
  Administered 2019-09-29: 25 mg via ORAL
  Filled 2019-09-29: qty 10

## 2019-09-29 MED ORDER — FENTANYL CITRATE (PF) 100 MCG/2ML IJ SOLN: 25.0000 ug | INTRAMUSCULAR | Status: DC | PRN

## 2019-09-29 MED ORDER — CEFAZOLIN SODIUM-DEXTROSE 2-4 GM/100ML-% IV SOLN
INTRAVENOUS | Status: AC
  Filled 2019-09-29: qty 100

## 2019-09-29 MED ORDER — TRANEXAMIC ACID-NACL 1000-0.7 MG/100ML-% IV SOLN
INTRAVENOUS | Status: AC
  Filled 2019-09-29: qty 100

## 2019-09-29 MED ORDER — EPHEDRINE 5 MG/ML INJ
INTRAVENOUS | Status: AC
  Filled 2019-09-29: qty 30

## 2019-09-29 MED ORDER — OXYCODONE HCL 5 MG/5ML PO SOLN: 5.0000 mg | Freq: Once | ORAL | Status: DC | PRN

## 2019-09-29 MED ORDER — PHENYLEPHRINE HCL-NACL 10-0.9 MG/250ML-% IV SOLN
INTRAVENOUS | Status: DC | PRN
Start: 2019-09-29 — End: 2019-09-29
  Administered 2019-09-29: 50 ug/min via INTRAVENOUS

## 2019-09-29 MED ORDER — SUCCINYLCHOLINE CHLORIDE 20 MG/ML IJ SOLN
INTRAMUSCULAR | Status: DC | PRN
  Administered 2019-09-29: 100 mg via INTRAVENOUS

## 2019-09-29 MED ORDER — OXYCODONE HCL 5 MG PO TABS: 10.0000 mg | ORAL_TABLET | ORAL | Status: DC | PRN

## 2019-09-29 MED ORDER — OXYCODONE HCL 5 MG PO TABS: 5.0000 mg | ORAL_TABLET | ORAL | Status: DC | PRN

## 2019-09-29 MED ORDER — ONDANSETRON HCL 4 MG PO TABS: 4.0000 mg | ORAL_TABLET | Freq: Four times a day (QID) | ORAL | Status: DC | PRN

## 2019-09-29 MED ORDER — 0.9 % SODIUM CHLORIDE (POUR BTL) OPTIME
TOPICAL | Status: DC | PRN
  Administered 2019-09-29: 1000 mL

## 2019-09-29 MED ORDER — CEFAZOLIN SODIUM-DEXTROSE 2-4 GM/100ML-% IV SOLN
2.0000 g | INTRAVENOUS | Status: AC
  Administered 2019-09-29: 2 g via INTRAVENOUS

## 2019-09-29 MED ORDER — PROPOFOL 10 MG/ML IV BOLUS
INTRAVENOUS | Status: DC | PRN
  Administered 2019-09-29: 150 mg via INTRAVENOUS

## 2019-09-29 MED ORDER — MAGNESIUM CITRATE PO SOLN: 1.0000 | Freq: Once | ORAL | Status: DC | PRN

## 2019-09-29 MED ORDER — LACTATED RINGERS IV SOLN: INTRAVENOUS | Status: DC

## 2019-09-29 MED ORDER — MENTHOL 3 MG MT LOZG: 1.0000 | LOZENGE | OROMUCOSAL | Status: DC | PRN

## 2019-09-29 MED ORDER — SUGAMMADEX SODIUM 200 MG/2ML IV SOLN
INTRAVENOUS | Status: DC | PRN
  Administered 2019-09-29: 200 mg via INTRAVENOUS

## 2019-09-29 MED ORDER — TRANEXAMIC ACID-NACL 1000-0.7 MG/100ML-% IV SOLN
1000.0000 mg | INTRAVENOUS | Status: AC
  Administered 2019-09-29: 1000 mg via INTRAVENOUS

## 2019-09-29 SURGICAL SUPPLY — 68 items
BAG ZIPLOCK 12X15 (MISCELLANEOUS) ×3 IMPLANT
BLADE SAW SGTL 83.5X18.5 (BLADE) ×3 IMPLANT
COOLER ICEMAN CLASSIC (MISCELLANEOUS) IMPLANT
COVER BACK TABLE 60X90IN (DRAPES) ×3 IMPLANT
COVER SURGICAL LIGHT HANDLE (MISCELLANEOUS) ×3 IMPLANT
COVER WAND RF STERILE (DRAPES) IMPLANT
CUP SUT UNIV REVERS 39 NEU (Shoulder) ×3 IMPLANT
DERMABOND ADVANCED (GAUZE/BANDAGES/DRESSINGS) ×2
DERMABOND ADVANCED .7 DNX12 (GAUZE/BANDAGES/DRESSINGS) ×1 IMPLANT
DRAPE INCISE IOBAN 66X45 STRL (DRAPES) IMPLANT
DRAPE ORTHO SPLIT 77X108 STRL (DRAPES) ×6
DRAPE SHEET LG 3/4 BI-LAMINATE (DRAPES) ×3 IMPLANT
DRAPE SURG 17X11 SM STRL (DRAPES) ×3 IMPLANT
DRAPE SURG ORHT 6 SPLT 77X108 (DRAPES) ×2 IMPLANT
DRAPE U-SHAPE 47X51 STRL (DRAPES) ×3 IMPLANT
DRESSING AQUACEL AG SP 3.5X10 (GAUZE/BANDAGES/DRESSINGS) ×1 IMPLANT
DRSG AQUACEL AG ADV 3.5X10 (GAUZE/BANDAGES/DRESSINGS) ×3 IMPLANT
DRSG AQUACEL AG SP 3.5X10 (GAUZE/BANDAGES/DRESSINGS) ×3
DURAPREP 26ML APPLICATOR (WOUND CARE) ×3 IMPLANT
ELECT BLADE TIP CTD 4 INCH (ELECTRODE) ×3 IMPLANT
ELECT REM PT RETURN 15FT ADLT (MISCELLANEOUS) ×3 IMPLANT
FACESHIELD WRAPAROUND (MASK) ×12 IMPLANT
GLENOID UNI REV MOD 24 +2 LAT (Joint) ×3 IMPLANT
GLENOSPHERE 39+4 LAT/24 UNI RV (Joint) ×3 IMPLANT
GLOVE BIO SURGEON STRL SZ7.5 (GLOVE) ×3 IMPLANT
GLOVE BIO SURGEON STRL SZ8 (GLOVE) ×3 IMPLANT
GLOVE SS BIOGEL STRL SZ 7 (GLOVE) ×1 IMPLANT
GLOVE SS BIOGEL STRL SZ 7.5 (GLOVE) ×1 IMPLANT
GLOVE SUPERSENSE BIOGEL SZ 7 (GLOVE) ×2
GLOVE SUPERSENSE BIOGEL SZ 7.5 (GLOVE) ×2
GLOVE SURG SYN 7.0 (GLOVE) IMPLANT
GLOVE SURG SYN 7.5  E (GLOVE)
GLOVE SURG SYN 7.5 E (GLOVE) IMPLANT
GLOVE SURG SYN 8.0 (GLOVE) IMPLANT
GOWN STRL REUS W/TWL LRG LVL3 (GOWN DISPOSABLE) ×6 IMPLANT
INSERT HUMERAL 39/+6 (Insert) ×3 IMPLANT
KIT BASIN (CUSTOM PROCEDURE TRAY) ×3 IMPLANT
KIT TURNOVER KIT A (KITS) IMPLANT
MANIFOLD NEPTUNE II (INSTRUMENTS) ×3 IMPLANT
NEEDLE TAPERED W/ NITINOL LOOP (MISCELLANEOUS) ×3 IMPLANT
NS IRRIG 1000ML POUR BTL (IV SOLUTION) ×3 IMPLANT
PACK SHOULDER (CUSTOM PROCEDURE TRAY) ×3 IMPLANT
PAD ARMBOARD 7.5X6 YLW CONV (MISCELLANEOUS) ×3 IMPLANT
PAD COLD SHLDR WRAP-ON (PAD) IMPLANT
RESTRAINT HEAD UNIVERSAL NS (MISCELLANEOUS) ×3 IMPLANT
SCREW CENTRAL MOD 30MM (Screw) ×3 IMPLANT
SCREW PERI LOCK 5.5X16 (Screw) ×3 IMPLANT
SCREW PERI LOCK 5.5X24 (Screw) ×3 IMPLANT
SCREW PERI LOCK 5.5X36 (Screw) ×3 IMPLANT
SCREW PERIPHERAL 5.5X28 LOCK (Screw) ×3 IMPLANT
SLING ARM FOAM STRAP LRG (SOFTGOODS) IMPLANT
SLING ARM FOAM STRAP MED (SOFTGOODS) IMPLANT
SLING ARM IMMOBILIZER LRG (SOFTGOODS) ×3 IMPLANT
SPONGE LAP 18X18 RF (DISPOSABLE) IMPLANT
STEM HUMERAL UNIVERS SZ8 (Stem) ×3 IMPLANT
SUCTION FRAZIER HANDLE 12FR (TUBING) ×3
SUCTION TUBE FRAZIER 12FR DISP (TUBING) ×1 IMPLANT
SUT FIBERWIRE #2 38 T-5 BLUE (SUTURE)
SUT MNCRL AB 3-0 PS2 18 (SUTURE) ×3 IMPLANT
SUT MON AB 2-0 CT1 36 (SUTURE) ×3 IMPLANT
SUT VIC AB 1 CT1 36 (SUTURE) ×3 IMPLANT
SUTURE FIBERWR #2 38 T-5 BLUE (SUTURE) IMPLANT
SUTURE TAPE 1.3 40 TPR END (SUTURE) ×2 IMPLANT
SUTURETAPE 1.3 40 TPR END (SUTURE) ×6
TOWEL OR 17X26 10 PK STRL BLUE (TOWEL DISPOSABLE) ×3 IMPLANT
TOWEL OR NON WOVEN STRL DISP B (DISPOSABLE) ×3 IMPLANT
WATER STERILE IRR 1000ML POUR (IV SOLUTION) ×6 IMPLANT
YANKAUER SUCT BULB TIP 10FT TU (MISCELLANEOUS) ×3 IMPLANT

## 2019-09-29 NOTE — Transfer of Care (Signed)
Immediate Anesthesia Transfer of Care Note  Patient: Jerome Duncan  Procedure(s) Performed: REVERSE SHOULDER ARTHROPLASTY (Right Shoulder)  Patient Location: PACU  Anesthesia Type:General  Level of Consciousness: sedated, patient cooperative and responds to stimulation  Airway & Oxygen Therapy: Patient Spontanous Breathing and Patient connected to face mask oxygen  Post-op Assessment: Report given to RN and Post -op Vital signs reviewed and stable  Post vital signs: Reviewed and stable  Last Vitals:  Vitals Value Taken Time  BP 100/54 09/29/19 0937  Temp    Pulse 72 09/29/19 0939  Resp 17 09/29/19 0939  SpO2 95 % 09/29/19 0939  Vitals shown include unvalidated device data.  Last Pain:  Vitals:   09/29/19 0611  TempSrc: Oral  PainSc:          Complications: No apparent anesthesia complications

## 2019-09-29 NOTE — Discharge Instructions (Signed)
 Kevin M. Supple, M.D., F.A.A.O.S. Orthopaedic Surgery Specializing in Arthroscopic and Reconstructive Surgery of the Shoulder [phone] [address] - [place], [place] [address] - Fax [phone]   POST-OP TOTAL SHOULDER REPLACEMENT INSTRUCTIONS  1. Follow up in the office for your first post-op appointment 10-14 days from the date of your surgery. If you do not already have a scheduled appointment, our office will contact you to schedule.  2. The bandage over your incision is waterproof. You may begin showering with this dressing on. You may leave this dressing on until first follow up appointment within 2 weeks. We prefer you leave this dressing in place until follow up however after 5-7 days if you are having itching or skin irritation and would like to remove it you may do so. Go slow and tug at the borders gently to break the bond the dressing has with the skin. At this point if there is no drainage it is okay to go without a bandage or you may cover it with a light guaze and tape. You can also expect significant bruising around your shoulder that will drift down your arm and into your chest wall. This is very normal and should resolve over several days.   3. Wear your sling/immobilizer at all times except to perform the exercises below or to occasionally let your arm dangle by your side to stretch your elbow. You also need to sleep in your sling immobilizer until instructed otherwise. It is ok to remove your sling if you are sitting in a controlled environment and allow your arm to rest in a position of comfort by your side or on your lap with pillows to give your neck and skin a break from the sling. You may remove it to allow arm to dangle by side to shower. If you are up walking around and when you go to sleep at night you need to wear it.  4. Range of motion to your elbow, wrist, and hand are encouraged 3-5 times daily. Exercise to your hand and fingers helps to reduce  swelling you may experience.   5. Prescriptions for a pain medication and a muscle relaxant are provided for you. It is recommended that if you are experiencing pain that you pain medication alone is not controlling, add the muscle relaxant along with the pain medication which can give additional pain relief. The first 1-2 days is generally the most severe of your pain and then should gradually decrease. As your pain lessens it is recommended that you decrease your use of the pain medications to an "as needed basis'" only and to always comply with the recommended dosages of the pain medications.  6. Pain medications can produce constipation along with their use. If you experience this, the use of an over the counter stool softener or laxative daily is recommended.   7. For additional questions or concerns, please do not hesitate to call the office. If after hours there is an answering service to forward your concerns to the physician on call.  8.Pain control following an exparel block  To help control your post-operative pain you received a nerve block  performed with Exparel which is a long acting anesthetic (numbing agent) which can provide pain relief and sensations of numbness (and relief of pain) in the operative shoulder and arm for up to 3 days. Sometimes it provides mixed relief, meaning you may still have numbness in certain areas of the arm but can still be able to   move  parts of that arm, hand, and fingers. We recommend that your prescribed pain medications  be used as needed. We do not feel it is necessary to "pre medicate" and "stay ahead" of pain.  Taking narcotic pain medications when you are not having any pain can lead to unnecessary and potentially dangerous side effects.    9. Use the ice machine as much as possible in the first 5-7 days from surgery, then you can wean its use to as needed. The ice typically needs to be replaced every 6 hours, instead of ice you can actually freeze  water bottles to put in the cooler and then fill water around them to avoid having to purchase ice. You can have spare water bottles freezing to allow you to rotate them once they have melted. Try to have a thin shirt or light cloth or towel under the ice wrap to protect your skin.   10.  We recommend that you avoid any dental work or cleaning in the first 3 months following your joint replacement. This is to help minimize the possibility of infection from the bacteria in your mouth that enters your bloodstream during dental work. We also recommend that you take an antibiotic prior to your dental work for the first year after your shoulder replacement to further help reduce that risk. Please simply contact our office for antibiotics to be sent to your pharmacy prior to dental work.  POST-OP EXERCISES  Pendulum Exercises  Perform pendulum exercises while standing and bending at the waist. Support your uninvolved arm on a table or chair and allow your operated arm to hang freely. Make sure to do these exercises passively - not using you shoulder muscles. These exercises can be performed once your nerve block effects have worn off.  Repeat 20 times. Do 3 sessions per day.

## 2019-09-29 NOTE — H&P (Signed)
Jerome Duncan    Chief Complaint: Right shoulder rotator cuff tear arthropathy HPI: The patient is a 74 y.o. male with chronic and progressively increasing right shoulder pain and associated functional imitations related to advanced rotator cuff tear arthropathy.  Past Medical History:  Diagnosis Date  . Anginal pain ([place])   . Complication of anesthesia   . Cyst, bone    L4-L5 spine  . Heart attack ([place])   . PONV (postoperative nausea and vomiting)   . Stroke [facility])     Past Surgical History:  Procedure Laterality Date  . APPENDECTOMY    . CARDIAC CATHETERIZATION    . HERNIA REPAIR    . TONSILLECTOMY      History reviewed. No pertinent family history.  Social History:  reports that he quit smoking about 23 years ago. His smoking use included cigarettes. He has a 30.00 pack-year smoking history. He has never used smokeless tobacco. He reports that he does not drink alcohol or use drugs.   Medications Prior to Admission  Medication Sig Dispense Refill  . acetaminophen (TYLENOL) 500 MG tablet Take 500-1,000 mg by mouth every 6 (six) hours as needed (pain.).    Marland Kitchen cyclobenzaprine (FLEXERIL) 5 MG tablet Take 5 mg by mouth at bedtime.    . diphenhydramine-acetaminophen (TYLENOL PM) 25-500 MG TABS tablet Take 1 tablet by mouth at bedtime.    . triamcinolone cream (KENALOG) 0.1 % Apply 1 application topically 2 (two) times daily as needed (poison ivy/skin irritation.).   0     Physical Exam: Right shoulder demonstrates painful and guarded motion as noted at recent office visit.  Plain radiographs confirm advanced arthritis with a high riding humeral head and changes consistent with severe rotator cuff tear arthropathy.  Vitals  Temp:  [98.2 F (36.8 C)-98.4 F (36.9 C)] 98.2 F (36.8 C) (05/04 0611) Pulse Rate:  [73-74] 73 (05/04 0611) Resp:  [18] 18 (05/04 0611) BP: (133-143)/(77-82) 133/77 (05/04 0611) SpO2:  [96 %-98 %] 98 % (05/04 0611) Weight:  [80.7 kg] 80.7 kg  (05/04 0556)  Assessment/Plan  Impression: Right shoulder rotator cuff tear arthropathy  Plan of Action: Procedure(s): REVERSE SHOULDER ARTHROPLASTY  Jerome Duncan M Jerome Duncan 09/29/2019, 6:43 AM Contact # [phone]

## 2019-09-29 NOTE — Anesthesia Procedure Notes (Signed)
Procedure Name: Intubation Performed by: Howell Groesbeck J, CRNA Pre-anesthesia Checklist: Patient identified, Emergency Drugs available, Suction available, Patient being monitored and Timeout performed Patient Re-evaluated:Patient Re-evaluated prior to induction Oxygen Delivery Method: Circle system utilized Preoxygenation: Pre-oxygenation with 100% oxygen Induction Type: IV induction Ventilation: Mask ventilation without difficulty Laryngoscope Size: Mac and 4 Grade View: Grade III Tube type: Oral Tube size: 7.5 mm Number of attempts: 1 Airway Equipment and Method: Stylet Placement Confirmation: ETT inserted through vocal cords under direct vision,  positive ETCO2 and breath sounds checked- equal and bilateral Secured at: 23 cm Tube secured with: Tape Dental Injury: Teeth and Oropharynx as per pre-operative assessment        

## 2019-09-29 NOTE — Anesthesia Procedure Notes (Signed)
Anesthesia Regional Block: Interscalene brachial plexus block   Pre-Anesthetic Checklist: ,, timeout performed, Correct Patient, Correct Site, Correct Laterality, Correct Procedure, Correct Position, site marked, Risks and benefits discussed,  Surgical consent,  Pre-op evaluation,  At surgeon's request and post-op pain management  Laterality: Right  Prep: chloraprep       Needles:  Injection technique: Single-shot  Needle Type: Echogenic Needle     Needle Length: 5cm  Needle Gauge: 21     Additional Needles:   Narrative:  Start time: 09/29/2019 6:54 AM End time: 09/29/2019 6:58 AM Injection made incrementally with aspirations every 5 mL.  Performed by: Personally  Anesthesiologist: Audry Pili, MD  Additional Notes: No pain on injection. No increased resistance to injection. Injection made in 5cc increments. Good needle visualization. Patient tolerated the procedure well.

## 2019-09-29 NOTE — Anesthesia Postprocedure Evaluation (Signed)
Anesthesia Post Note  Patient: Jerome Duncan  Procedure(s) Performed: REVERSE SHOULDER ARTHROPLASTY (Right Shoulder)     Patient location during evaluation: PACU Anesthesia Type: General Level of consciousness: awake and alert Pain management: pain level controlled Vital Signs Assessment: post-procedure vital signs reviewed and stable Respiratory status: spontaneous breathing, nonlabored ventilation, respiratory function stable and patient connected to nasal cannula oxygen Cardiovascular status: blood pressure returned to baseline and stable Postop Assessment: no apparent nausea or vomiting Anesthetic complications: no    Last Vitals:  Vitals:   09/29/19 1045 09/29/19 1100  BP: (!) 110/57 106/65  Pulse: 64 66  Resp: 13 19  Temp: (!) 36.3 C   SpO2: 96% 95%    Last Pain:  Vitals:   09/29/19 1100  TempSrc:   PainSc: 0-No pain                 Audry Pili

## 2019-09-29 NOTE — Plan of Care (Signed)
  Problem: Clinical Measurements: Goal: Ability to maintain clinical measurements within normal limits will improve Outcome: Progressing Goal: Will remain free from infection Outcome: Progressing   Problem: Nutrition: Goal: Adequate nutrition will be maintained Outcome: Progressing   

## 2019-09-29 NOTE — Op Note (Signed)
09/29/2019  9:14 AM  PATIENT:   Maia Petties  74 y.o. male  PRE-OPERATIVE DIAGNOSIS:  Right shoulder rotator cuff tear arthropathy  POST-OPERATIVE DIAGNOSIS: Same  PROCEDURE: Right shoulder reverse arthroplasty utilizing a press-fit size 8 Arthrex stem with a +6 polyethylene insert, neutral metaphysis, 39/+4 glenosphere on a small/+2 baseplate  SURGEON:  Jamari Moten, Metta Clines M.D.  ASSISTANTS: Jenetta Loges, PA-C  ANESTHESIA:   General endotracheal and interscalene block with Exparel  EBL: 200 cc  SPECIMEN: None  Drains: None   PATIENT DISPOSITION:  PACU - hemodynamically stable.    PLAN OF CARE: Admit for overnight observation  Brief history:  Mr. Oria is a 74 year old gentleman who has had chronic and progressively increasing right shoulder pain related to advanced rotator cuff tear arthropathy.  Due to his increasing functional mutations and failure to respond to conservative management he is brought to the operating this time for planned right shoulder reverse arthroplasty.  Preoperatively had counseled Mr. Feinstein regarding treatment options as well as the potential risks versus benefits thereof.  Possible surgical complications were reviewed including bleeding, infection, neurovascular injury, persistent pain, loss of motion, anesthetic complication, failure of the implant, and possible need for additional surgery.  He understands, and accepts, and agrees with our planned procedure.  Procedure in detail:  After undergoing routine preop evaluation patient received prophylactic antibiotics and interscalene block with Exparel was established in the holding area by the anesthesia department.  Subsequently brought to the operating room and on his hospital bed underwent smooth induction of a general endotracheal anesthesia.  He was then transferred to the [facility] bed and appropriately padded and protected.  Subsequently placed into the beachchair position and appropriately padded  and protected.  The right shoulder girdle region was sterilely prepped and draped in standard fashion.  Timeout was called.  An anterior deltopectoral approach to the right shoulder is made through a 10 cm incision.  Skin flaps elevated dissection carried deeply the deltopectoral interval was then developed from proximal to distal with the vein taken laterally.  Of note there were multiple varicosities at this interval which were carefully protected and electrocoagulated as necessary.  Upper centimeter and half the pectoralis major tendon was then tenotomized for exposure.  The conjoined tendon mobilized and retracted medially.  Multiple varicosities also noted at the inferior border of the subscapularis which were cauterized as well.  Previous rupture long head bicep tendon was noted.  We separated the subscap from the lesser tuberosity in a subperiosteal peel fashion and tagged the margin with a suture tape suture but the quality of the tissue was quite attenuated and atrophic and appeared to be nonviable.  We divided the capsular attachments from the anterior and inferior margins of the humeral neck and humeral head was then delivered through the wound.  We utilized an extra medullary guide to outline the proposed humeral head resection which was then performed with an oscillating saw at approximately 30 degrees retroversion.  A metal cap was then placed over the cut proximal humeral surface we then exposed the glenoid with appropriate retractors.  I performed a circumferential labral resection gaining complete visualization of the periphery of the glenoid.  A guidepin was then directed into the center of the glenoid and the glenoid was then reamed with an approximately 10 degree inferior tilt.  The peripheral reamer was then utilized.  A stable subchondral bony bed was achieved.  All bony debris was then meticulously removed.  The central drill hole was then  drilled and tapped and a 30 mm length.  Our final  baseplate was then assembled on the back table and this was then inserted with excellent purchase and fixation.  The peripheral locking screws were all then placed with good purchase and fixation.  We then placed the final 39/+4 glenosphere onto the baseplate this was then impacted and the central locking screw was then placed.  At this point we then returned our attention to the proximal humerus where the canal was opened hand reaming up to size 7 broached up to size 8 and the metaphysis was then reamed with a neutral reamer.  A trial was placed and reduction showed good soft tissue balance.  At this point our final humeral implant was assembled on the back table and it was then impacted with excellent fit and fixation.  Trial reduction at this point showed the +6 with the best soft tissue balance and good stability good motion.  The trial was removed the final +6 polywas then impacted and final reduction was then performed.  We are very pleased with the overall mobility, stability, and soft tissue balance.  The wound was copiously irrigated.  We confirmed that the subscapularis lacked appropriate elasticity and caliber for repair so it was allowed to retract.  Wound was then copes irrigated.  Final hemostasis was obtained.  The deltopectoral interval was then closed with a series of figure-of-eight number Vicryl sutures.  2-0 Vicryl used for subcu layer and intracuticular 3-0 Monocryl for the skin followed by Dermabond and Aquacel dressing.  Right arm was placed in a sling and the patient was awakened, extubated, and taken to the recovery room in stable condition.  Jenetta Loges, PA-C was utilized as an Environmental consultant throughout this case essential for help with positioning the patient, positioning extremity, tissue manipulation, implantation of the prosthesis, wound closure, and intraoperative decision-making.  Metta Clines Dionta Larke MD   Contact # [phone]

## 2019-09-30 DIAGNOSIS — M75101 Unspecified rotator cuff tear or rupture of right shoulder, not specified as traumatic: Secondary | ICD-10-CM | POA: Diagnosis not present

## 2019-09-30 MED ORDER — HYDROCODONE-ACETAMINOPHEN 5-325 MG PO TABS: 1.0000 | ORAL_TABLET | ORAL | 0 refills | Status: DC | PRN

## 2019-09-30 MED ORDER — CYCLOBENZAPRINE HCL 5 MG PO TABS: 5.0000 mg | ORAL_TABLET | Freq: Three times a day (TID) | ORAL | 0 refills | Status: AC | PRN

## 2019-09-30 MED ORDER — ONDANSETRON HCL 4 MG PO TABS: 4.0000 mg | ORAL_TABLET | Freq: Three times a day (TID) | ORAL | 0 refills | Status: DC | PRN

## 2019-09-30 NOTE — Discharge Summary (Signed)
PATIENT ID:      Jerome Duncan  MRN:     [id number] DOB/AGE:    July 22, 1945 / 74 y.o.     DISCHARGE SUMMARY  ADMISSION DATE:    09/29/2019 DISCHARGE DATE:    ADMISSION DIAGNOSIS: Right shoulder rotator cuff tear arthropathy Past Medical History:  Diagnosis Date  . Anginal pain ([place])   . Complication of anesthesia   . Cyst, bone    L4-L5 spine  . Heart attack ([place])   . PONV (postoperative nausea and vomiting)   . Stroke [facility])     DISCHARGE DIAGNOSIS:   Active Problems:   S/P reverse total shoulder arthroplasty, right   PROCEDURE: Procedure(s): REVERSE SHOULDER ARTHROPLASTY on 09/29/2019  CONSULTS:    HISTORY:  See H&P in chart.  HOSPITAL COURSE:  Jerome Duncan is a 74 y.o. admitted on 09/29/2019 with a diagnosis of Right shoulder rotator cuff tear arthropathy.  Jerome Duncan were brought to the operating room on 09/29/2019 and underwent Procedure(s): [place].    Jerome Duncan were given perioperative antibiotics:  Anti-infectives (From admission, onward)   Start     Dose/Rate Route Frequency Ordered Stop   09/29/19 0615  ceFAZolin (ANCEF) IVPB 2g/100 mL premix     2 g 200 mL/hr over 30 Minutes Intravenous On call to O.R. 09/29/19 0604 09/29/19 0755   09/29/19 0606  ceFAZolin (ANCEF) 2-4 GM/100ML-% IVPB    Note to Pharmacy: Karsten Ro   : cabinet override      09/29/19 0606 09/29/19 0809    .  Patient underwent the above named procedure and tolerated it well. The following day Jerome Duncan were hemodynamically stable and pain was controlled on oral analgesics. Jerome Duncan were neurovascularly intact to the operative extremity. OT was ordered and worked with patient per protocol. Jerome Duncan were medically and orthopaedically stable for discharge on .    DIAGNOSTIC STUDIES:  RECENT RADIOGRAPHIC STUDIES :  No results found.  RECENT VITAL SIGNS:   Patient Vitals for the past 24 hrs:  BP Temp Temp src Pulse Resp SpO2 Height Weight  09/30/19 0629 (!) 143/76 98.4 F (36.9 C) Oral 83 18  96 % -- --  09/30/19 0144 138/69 98 F (36.7 C) Oral 74 18 97 % -- --  09/29/19 2018 136/70 98 F (36.7 C) Oral 86 18 95 % -- --  09/29/19 1739 116/62 98 F (36.7 C) Oral 87 18 96 % -- --  09/29/19 1630 120/65 98.8 F (37.1 C) Oral 74 18 97 % -- --  09/29/19 1530 117/65 97.7 F (36.5 C) Oral 74 18 99 % -- --  09/29/19 1435 119/64 98 F (36.7 C) Oral 62 15 96 % 5\' 9"  (1.753 m) 80.7 kg  09/29/19 1400 112/60 98 F (36.7 C) -- (!) 53 16 96 % -- --  09/29/19 1300 118/63 -- -- 61 16 97 % -- --  09/29/19 1200 116/63 98 F (36.7 C) -- 62 -- 95 % -- --  09/29/19 1100 106/65 -- -- 66 19 95 % -- --  09/29/19 1045 (!) 110/57 (!) 97.3 F (36.3 C) -- 64 13 96 % -- --  09/29/19 1030 (!) 104/59 -- -- 66 14 95 % -- --  09/29/19 1015 (!) 104/59 -- -- 69 16 95 % -- --  09/29/19 1000 105/61 -- -- 64 13 96 % -- --  09/29/19 0945 (!) 106/56 -- -- 68 13 98 % -- --  09/29/19 0937 (!) 100/54 (!) 97.4 F (36.3 C) -- --  13 98 % -- --  .  RECENT EKG RESULTS:    Orders placed or performed during the hospital encounter of 09/28/19  . EKG 12-Lead  . EKG 12-Lead    DISCHARGE INSTRUCTIONS:    DISCHARGE MEDICATIONS:   Allergies as of 09/30/2019      Reactions   Sulfa Antibiotics Nausea Only   Erythromycin Palpitations      Medication List    TAKE these medications   acetaminophen 500 MG tablet Commonly known as: TYLENOL Take 500-1,000 mg by mouth every 6 (six) hours as needed (pain.).   cyclobenzaprine 5 MG tablet Commonly known as: FLEXERIL Take 1 tablet (5 mg total) by mouth 3 (three) times daily as needed for muscle spasms. What changed:   when to take this  reasons to take this   diphenhydramine-acetaminophen 25-500 MG Tabs tablet Commonly known as: TYLENOL PM Take 1 tablet by mouth at bedtime.   HYDROcodone-acetaminophen 5-325 MG tablet Commonly known as: Norco Take 1 tablet by mouth every 4 (four) hours as needed for moderate pain.   ondansetron 4 MG tablet Commonly known as:  Zofran Take 1 tablet (4 mg total) by mouth every 8 (eight) hours as needed for nausea or vomiting.   triamcinolone cream 0.1 % Commonly known as: KENALOG Apply 1 application topically 2 (two) times daily as needed (poison ivy/skin irritation.).       FOLLOW UP VISIT:   Follow-up Information    Jerome Duncan.   Specialty: Orthopedic Surgery Why: call to be seen in 10-14 days Contact information: [address] [place] [address] [id number]           DISCHARGE TO: Home   DISCHARGE CONDITION:  Jerome Duncan for Dr. [name] 09/30/2019, 8:11 AM

## 2019-09-30 NOTE — Evaluation (Signed)
Occupational Therapy Evaluation Patient Details Name: Jerome Duncan MRN: [id number] DOB: [birth year] Today's Date: 09/30/2019    History of Present Illness Patient is a 74 year old male admitted for right reverse total shoulder arthroplasty.   Clinical Impression   Patient and spouse educated on post op shoulder instructions listed below; patient and spouse demonstrate understanding and provided handouts for reference at home. All questions answered.     Follow Up Recommendations  Follow surgeon's recommendation for DC plan and follow-up therapies    Equipment Recommendations  None recommended by OT       Precautions / Restrictions Precautions Precautions: Shoulder Type of Shoulder Precautions: If sitting in controlled environment, ok to come out of sling to give neck a break. Please sleep in it to protect until follow up in office. OK to use operative arm for feeding, hygiene and ADLs. Ok to instruct Pendulums and lap slides as exercises. Ok to use operative arm within the following parameters for ADL purposes. Ok for PROM, AAROM, AROM within pain tolerance and within the following ROM . ER 20, FF 60, ABD 45 Shoulder Interventions: Shoulder sling/immobilizer;Off for dressing/bathing/exercises Precaution Booklet Issued: Yes (comment) Required Braces or Orthoses: Sling Restrictions Weight Bearing Restrictions: Yes RUE Weight Bearing: Non weight bearing      Mobility Bed Mobility Overal bed mobility: Modified Independent                Transfers Overall transfer level: Needs assistance Equipment used: None Transfers: Sit to/from Stand Sit to Stand: Supervision              Balance Overall balance assessment: No apparent balance deficits (not formally assessed)                                         ADL either performed or assessed with clinical judgement   ADL Overall ADL's : Needs assistance/impaired Eating/Feeding: Set up;Sitting    Grooming: Set up;Sitting   Upper Body Bathing: Minimal assistance;Sitting   Lower Body Bathing: Supervison/ safety;Sit to/from stand   Upper Body Dressing : Minimal assistance;Sitting;Cueing for compensatory techniques   Lower Body Dressing: Supervision/safety;Sitting/lateral leans;Sit to/from stand   Toilet Transfer: Supervision/safety;Ambulation   Toileting- Clothing Manipulation and Hygiene: Supervision/safety;Sitting/lateral lean;Sit to/from stand       Functional mobility during ADLs: Supervision/safety       Vision Baseline Vision/History: Wears glasses              Pertinent Vitals/Pain Pain Assessment: Faces Faces Pain Scale: Hurts a little bit Pain Location: R shoulder Pain Descriptors / Indicators: Sore Pain Intervention(s): Monitored during session     Hand Dominance Right   Extremity/Trunk Assessment Upper Extremity Assessment Upper Extremity Assessment: RUE deficits/detail RUE Deficits / Details: distal ROM elbow, wrist, hand WFL RUE: Unable to fully assess due to immobilization RUE Sensation: WNL   Lower Extremity Assessment Lower Extremity Assessment: Overall WFL for tasks assessed   Cervical / Trunk Assessment Cervical / Trunk Assessment: Normal   Communication Communication Communication: No difficulties   Cognition Arousal/Alertness: Awake/alert Behavior During Therapy: WFL for tasks assessed/performed Overall Cognitive Status: Within Functional Limits for tasks assessed                                     General Comments  O2 97% on room air  Exercises Exercises: Shoulder Shoulder Exercises Pendulum Exercise: 5 reps;Seated;Right Shoulder Flexion: PROM;Right;5 reps;Seated Shoulder ABduction: PROM;Right;5 reps;Seated Shoulder External Rotation: PROM;Right;5 reps;Seated Elbow Flexion: AROM;Right;5 reps;Seated Elbow Extension: AROM;Right;5 reps;Seated Wrist Flexion: AROM;Right;5 reps;Seated Wrist Extension:  AROM;Right;5 reps;Seated Digit Composite Flexion: AROM;Right;5 reps;Seated Composite Extension: AROM;Right;5 reps;Seated Neck Flexion: AROM;5 reps;Seated Neck Extension: AROM;5 reps;Seated   Shoulder Instructions Shoulder Instructions Donning/doffing shirt without moving shoulder: Patient able to independently direct caregiver;Caregiver independent with task Method for sponge bathing under operated UE: Patient able to independently direct caregiver;Caregiver independent with task Donning/doffing sling/immobilizer: Patient able to independently direct caregiver;Caregiver independent with task Correct positioning of sling/immobilizer: Patient able to independently direct caregiver;Caregiver independent with task Pendulum exercises (written home exercise program): Patient able to independently direct caregiver;Independent ROM for elbow, wrist and digits of operated UE: Independent;Patient able to independently direct caregiver Sling wearing schedule (on at all times/off for ADL's): Independent;Patient able to independently direct caregiver Proper positioning of operated UE when showering: Patient able to independently direct caregiver;Caregiver independent with task Positioning of UE while sleeping: Patient able to independently direct caregiver;Caregiver independent with task    Home Living Family/patient expects to be discharged to:: Private residence Living Arrangements: Spouse/significant other;Parent Available Help at Discharge: Family;Available 24 hours/day Type of Home: House Home Access: Level entry     Home Layout: Two level;Able to live on main level with bedroom/bathroom     Bathroom Shower/Tub: Walk-in shower         Home Equipment: Shower seat;Grab bars - tub/shower          Prior Functioning/Environment Level of Independence: Independent                 OT Problem List: Impaired UE functional use;Pain         OT Goals(Current goals can be found in the  care plan section) Acute Rehab OT Goals Patient Stated Goal: to go home OT Goal Formulation: With patient Time For Goal Achievement: 10/14/19 Potential to Achieve Goals: Good   AM-PAC OT "6 Clicks" Daily Activity     Outcome Measure Help from another person eating meals?: A Little Help from another person taking care of personal grooming?: A Little Help from another person toileting, which includes using toliet, bedpan, or urinal?: A Little Help from another person bathing (including washing, rinsing, drying)?: A Little Help from another person to put on and taking off regular upper body clothing?: A Little Help from another person to put on and taking off regular lower body clothing?: A Little 6 Click Score: 18   End of Session Equipment Utilized During Treatment: Other (comment)(sling) Nurse Communication: Mobility status  Activity Tolerance: Patient tolerated treatment well Patient left: in bed;with call bell/phone within reach;with family/visitor present  OT Visit Diagnosis: Pain Pain - Right/Left: Right Pain - part of body: Shoulder                Time: [id number] OT Time Calculation (min): 34 min Charges:  OT General Charges $OT Visit: 1 Visit OT Evaluation $OT Eval Moderate Complexity: 1 Mod  Delbert Phenix OT Pager: [place] 09/30/2019, 8:48 AM

## 2019-09-30 NOTE — Progress Notes (Signed)
Patient discharged to home w/ wife. Given all belongings, instructions, equipment. Verbalized understanding of all instructions. Escorted to pov via w/c. 

## 2019-10-02 ENCOUNTER — Encounter: Payer: Self-pay | Admitting: *Deleted

## 2019-10-07 ENCOUNTER — Encounter (HOSPITAL_COMMUNITY): Admission: RE | Admit: 2019-10-07 | Payer: Medicare Other | Source: Ambulatory Visit

## 2019-10-07 ENCOUNTER — Other Ambulatory Visit (HOSPITAL_COMMUNITY): Payer: Medicare Other

## 2019-10-07 ENCOUNTER — Encounter (HOSPITAL_COMMUNITY): Payer: Medicare Other

## 2020-03-30 ENCOUNTER — Encounter: Payer: Self-pay | Admitting: Internal Medicine

## 2020-03-30 ENCOUNTER — Telehealth: Payer: Self-pay | Admitting: Internal Medicine

## 2020-03-30 ENCOUNTER — Ambulatory Visit: Payer: Medicare Other | Admitting: Internal Medicine

## 2020-03-30 ENCOUNTER — Other Ambulatory Visit: Payer: Self-pay

## 2020-03-30 VITALS — BP 148/80 | HR 74 | Ht 69.0 in | Wt 183.0 lb

## 2020-03-30 DIAGNOSIS — E785 Hyperlipidemia, unspecified: Secondary | ICD-10-CM | POA: Diagnosis not present

## 2020-03-30 DIAGNOSIS — Z8673 Personal history of transient ischemic attack (TIA), and cerebral infarction without residual deficits: Secondary | ICD-10-CM | POA: Diagnosis not present

## 2020-03-30 DIAGNOSIS — T466X5A Adverse effect of antihyperlipidemic and antiarteriosclerotic drugs, initial encounter: Secondary | ICD-10-CM | POA: Insufficient documentation

## 2020-03-30 DIAGNOSIS — I252 Old myocardial infarction: Secondary | ICD-10-CM | POA: Diagnosis not present

## 2020-03-30 DIAGNOSIS — I2510 Atherosclerotic heart disease of native coronary artery without angina pectoris: Secondary | ICD-10-CM

## 2020-03-30 MED ORDER — REPATHA SURECLICK 140 MG/ML ~~LOC~~ SOAJ: 1.0000 | SUBCUTANEOUS | 3 refills | Status: DC

## 2020-03-30 NOTE — Progress Notes (Signed)
LIPID CLINIC CONSULT NOTE  Chief Complaint:  Manage dyslipidemia  Primary Care Physician: Jerome Low, MD  Primary Cardiologist:  No primary care provider on file.  HPI:  Jerome Duncan is a 74 y.o. male who is being seen today for the evaluation of dyslipidemia at the request of Jerome Low, MD.  This is a pleasant 74 year old male with a history of coronary artery disease previously followed by Dr. Tamala Duncan at which time he underwent cardiac catheterization in 2000.  He may have had an intervention but denied any coronary stent.  He was followed up clinically after that and then released.  He also was noted to have had an old stroke at some point.  He may have also suffered some mini strokes but underwent whole-body MRI imaging because of cysts along his spine and was noted to have stroke on MRI.  He has a history of dyslipidemia and unfortunately statin intolerance.  He is previously been on atorvastatin, rosuvastatin, niacin and more recently Zyptimag.  His most recent lipid profile showed a total cholesterol 06/28/1933, HDL 44, LDL of 169 and triglycerides 120.  PMHx:  Past Medical History:  Diagnosis Date  . Anginal pain ([place])   . Complication of anesthesia   . Cyst, bone    L4-L5 spine  . Heart attack ([place])   . PONV (postoperative nausea and vomiting)   . Stroke [facility])     Past Surgical History:  Procedure Laterality Date  . APPENDECTOMY    . CARDIAC CATHETERIZATION    . HERNIA REPAIR    . REVERSE SHOULDER ARTHROPLASTY Right 09/29/2019   Procedure: REVERSE SHOULDER ARTHROPLASTY;  Surgeon: Jerome Britain, MD;  Location: [facility] ORS;  Service: Orthopedics;  Laterality: Right;  145min  . TONSILLECTOMY      FAMHx:  Family History  Problem Relation Age of Onset  . Hypertension Mother   . CAD Father     SOCHx:   reports that he quit smoking about 23 years ago. His smoking use included cigarettes. He has a 30.00 pack-year smoking history. He has never used smokeless  tobacco. He reports that he does not drink alcohol and does not use drugs.  ALLERGIES:  Allergies  Allergen Reactions  . Sulfa Antibiotics Nausea Only  . Erythromycin Palpitations    ROS: Pertinent items noted in HPI and remainder of comprehensive ROS otherwise negative.  HOME MEDS: Current Outpatient Medications on File Prior to Visit  Medication Sig Dispense Refill  . acetaminophen (TYLENOL) 500 MG tablet Take 500-1,000 mg by mouth every 6 (six) hours as needed (pain.).    Marland Kitchen cyclobenzaprine (FLEXERIL) 5 MG tablet Take 1 tablet (5 mg total) by mouth 3 (three) times daily as needed for muscle spasms. 30 tablet 0  . diphenhydramine-acetaminophen (TYLENOL PM) 25-500 MG TABS tablet Take 1 tablet by mouth at bedtime.    . Evolocumab 140 MG/ML SOAJ Inject 1 Dose into the skin every 14 (fourteen) days.     No current facility-administered medications on file prior to visit.    LABS/IMAGING: No results found for this or any previous visit (from the past 48 hour(s)). No results found.  LIPID PANEL: No results found for: CHOL, TRIG, HDL, CHOLHDL, VLDL, LDLCALC, LDLDIRECT  WEIGHTS: Wt Readings from Last 3 Encounters:  03/30/20 183 lb (83 kg)  09/29/19 178 lb (80.7 kg)  09/28/19 178 lb (80.7 kg)    VITALS: BP (!) 148/80   Pulse 74   Ht 5\' 9"  (1.753 m)   Wt  183 lb (83 kg)   SpO2 98%   BMI 27.02 kg/m   EXAM: General appearance: alert and no distress Neck: no carotid bruit, no JVD and thyroid not enlarged, symmetric, no tenderness/mass/nodules Lungs: clear to auscultation bilaterally Heart: regular rate and rhythm Abdomen: soft, non-tender; bowel sounds normal; no masses,  no organomegaly Extremities: extremities normal, atraumatic, no cyanosis or edema Pulses: 2+ and symmetric Skin: Skin color, texture, turgor normal. No rashes or lesions Neurologic: Grossly normal Psych: Pleasant  EKG: Deferred  ASSESSMENT: 1. Mixed dyslipidemia, goal LDL less than 70 2. Statin  intolerance-memory loss, myalgias 3. History of coronary artery disease with MI in the 1990s 4. History of subclinical stroke 5. Hypertension  PLAN: 1.   Jerome Duncan has a history of coronary artery disease and subclinical stroke in the past as well as hypertension and family history of heart disease.  Unfortunately has been intolerant of statins which caused significant memory loss and some degree of myalgias, including atorvastatin, rosuvastatin, more recently.  pitavastatin, and also niacin.  He is a good candidate for PCSK9 inhibitor therapy, and we discussed pursuing Repatha today.  We will reach out for prior authorization.  He reported being fearful of needles however I think he will be able to tolerate the therapies.  If not, we could consider less potent options including ezetimibe or bempedoic acid however, the degree of lipid lowering and risk reduction with PCSK9 inhibitors is more significant.  Plan follow-up with me in 3 to 4 months with a repeat lipid profile.  Thanks again for the kind referral.  Jerome Casino, MD, FACC, [place] Director of the [facility] Lip[facility]  Cardiovascular Risk Reduction Clinic Diplomate of the American Board of Clinical Lipidology Attending Cardiologist  Direct Dial: [phone]  Fax: [phone]  Website:  [email or web address] Jerome Duncan 03/30/2020, 9:10 AM

## 2020-03-30 NOTE — Telephone Encounter (Signed)
PA for Repatha submitted via [facility] Request Reference Number: [id number]. REPATHA SURE INJ 140MG /ML is approved through 09/27/2020

## 2020-03-30 NOTE — Patient Instructions (Signed)
Medication Instructions:  Dr. Debara Pickett recommends Repatha or Praluent (PCSK9). This is an injectable cholesterol medication self-administered once every 14 days. This medication will likely need prior approval with your insurance company, which we will work on. If the medication is not approved initially, we may need to do an appeal with your insurance. We will keep you updated on this process.   Administer medication in area of fatty tissue such as abdomen, outer thigh, back up of arm - and rotate site with each injection Store medication in refrigerator until ready to administer - allow to sit at room temp for 30 mins - 1 hour prior to injection Dispose of medication in a SHARPS container - your pharmacy should be able to direct you on this and proper disposal   If you need co-pay assistance grant, please look into the program at healthwellfoundation.org >> disease funds >> hypercholesterolemia. This is an online application or you can call to complete. Once approved, you will provide the "pharmacy card" information to your pharmacy and they will deduct the co-pays from this grant.   *If you need a refill on your cardiac medications before your next appointment, please call your pharmacy*   Lab Work: FASTING lipid panel in 3 months to check cholesterol  Please complete before your next visit with Dr. Debara Pickett   If you have labs (blood work) drawn today and your tests are completely normal, you will receive your results only by: Marland Kitchen MyChart Message (if you have MyChart) OR . A paper copy in the mail If you have any lab test that is abnormal or we need to change your treatment, we will call you to review the results.   Testing/Procedures: NONE   Follow-Up: At [facility], you and your health needs are our priority.  As part of our continuing mission to provide you with exceptional heart care, we have created designated Provider Care Teams.  These Care Teams include your primary Cardiologist  (physician) and Advanced Practice Providers (APPs -  Physician Assistants and Nurse Practitioners) who all work together to provide you with the care you need, when you need it.  We recommend signing up for the patient portal called "MyChart".  Sign up information is provided on this After Visit Summary.  MyChart is used to connect with patients for Virtual Visits (Telemedicine).  Patients are able to view lab/test results, encounter notes, upcoming appointments, etc.  Non-urgent messages can be sent to your provider as well.   To learn more about what you can do with MyChart, go to [email or web address].    Your next appointment:   3-4 month(s) - lipid clinic  The format for your next appointment:   In Person  Provider:   K. Mali Hilty, MD   Other Instructions

## 2020-06-21 ENCOUNTER — Telehealth (HOSPITAL_COMMUNITY): Payer: Self-pay | Admitting: Family

## 2020-06-21 NOTE — Telephone Encounter (Signed)
Called to discuss with Jerome Duncan about Covid symptoms and the use of antiviral therapy/ monoclonal antibody infusion for those with mild to moderate Covid symptoms and at a high risk of hospitalization.     Pt is qualified for antiviral therapy/ infusio  due to co-morbid conditions and/or a member of an at-risk group, however declines infusion at this time. He states he would like to think about it. Symptoms tier reviewed as well as criteria for ending isolation.  Symptoms reviewed that would warrant ED/Hospital evaluation. Preventative practices reviewed. Patient verbalized understanding. Patient advised to call back if he decides that he does want to get infusion. Callback number to the infusion center given.     Patient Active Problem List   Diagnosis Date Noted  . Dyslipidemia, goal LDL below 70 03/30/2020  . History of MI (myocardial infarction) 03/30/2020  . CAD in native artery 03/30/2020  . History of stroke 03/30/2020  . Adverse effect of statin 03/30/2020  . S/P reverse total shoulder arthroplasty, right 09/29/2019    [place]

## 2020-06-22 ENCOUNTER — Ambulatory Visit (HOSPITAL_COMMUNITY)
Admission: RE | Admit: 2020-06-22 | Discharge: 2020-06-22 | Disposition: A | Discharge status: Home / Self Care | Payer: Medicare Other | Source: Ambulatory Visit | Attending: Pulmonary Disease | Admitting: Pulmonary Disease

## 2020-06-22 ENCOUNTER — Encounter: Payer: Self-pay | Admitting: Family

## 2020-06-22 ENCOUNTER — Other Ambulatory Visit: Payer: Self-pay | Admitting: Family

## 2020-06-22 DIAGNOSIS — U071 COVID-19: Secondary | ICD-10-CM | POA: Diagnosis not present

## 2020-06-22 MED ORDER — FAMOTIDINE IN NACL 20-0.9 MG/50ML-% IV SOLN: 20.0000 mg | Freq: Once | INTRAVENOUS | Status: DC | PRN

## 2020-06-22 MED ORDER — DIPHENHYDRAMINE HCL 50 MG/ML IJ SOLN
50.0000 mg | Freq: Once | INTRAMUSCULAR | Status: DC | PRN
Start: 2020-06-22 — End: 2020-06-23

## 2020-06-22 MED ORDER — EPINEPHRINE 0.3 MG/0.3ML IJ SOAJ: 0.3000 mg | Freq: Once | INTRAMUSCULAR | Status: DC | PRN

## 2020-06-22 MED ORDER — SOTROVIMAB 500 MG/8ML IV SOLN
500.0000 mg | Freq: Once | INTRAVENOUS | Status: AC
  Administered 2020-06-22: 500 mg via INTRAVENOUS

## 2020-06-22 MED ORDER — ALBUTEROL SULFATE HFA 108 (90 BASE) MCG/ACT IN AERS: 2.0000 | INHALATION_SPRAY | Freq: Once | RESPIRATORY_TRACT | Status: DC | PRN

## 2020-06-22 MED ORDER — METHYLPREDNISOLONE SODIUM SUCC 125 MG IJ SOLR: 125.0000 mg | Freq: Once | INTRAMUSCULAR | Status: DC | PRN

## 2020-06-22 MED ORDER — SODIUM CHLORIDE 0.9 % IV SOLN: INTRAVENOUS | Status: DC | PRN

## 2020-06-22 NOTE — Discharge Instructions (Signed)

## 2020-06-22 NOTE — Progress Notes (Signed)
Diagnosis: COVID-19  Physician: Dr. Patrick Wright  Procedure: Covid Infusion Clinic Med: Sotrovimab infusion - Provided patient with sotrovimab fact sheet for patients, parents, and caregivers prior to infusion.   Complications: No immediate complications noted  Discharge: Discharged home    

## 2020-06-22 NOTE — Telephone Encounter (Signed)
I connected by phone with Jerome Duncan on 06/22/2020 at 9:08 AM to discuss the potential use of a new treatment for mild to moderate COVID-19 viral infection in non-hospitalized patients.  This patient is a 75 y.o. male that meets the FDA criteria for Emergency Use Authorization of COVID monoclonal antibody sotrovimab.  Has a (+) direct SARS-CoV-2 viral test result  Has mild or moderate COVID-19   Is NOT hospitalized due to COVID-19  Is within 10 days of symptom onset  Has at least one of the high risk factor(s) for progression to severe COVID-19 and/or hospitalization as defined in EUA.  Specific high risk criteria : Older age (>/= 76 yo), Cardiovascular disease or hypertension and Other high risk medical condition per CDC:  hx of CVA   I have spoken and communicated the following to the patient or parent/caregiver regarding COVID monoclonal antibody treatment:  1. FDA has authorized the emergency use for the treatment of mild to moderate COVID-19 in adults and pediatric patients with positive results of direct SARS-CoV-2 viral testing who are 60 years of age and older weighing at least 40 kg, and who are at high risk for progressing to severe COVID-19 and/or hospitalization.  2. The significant known and potential risks and benefits of COVID monoclonal antibody, and the extent to which such potential risks and benefits are unknown.  3. Information on available alternative treatments and the risks and benefits of those alternatives, including clinical trials.  4. Patients treated with COVID monoclonal antibody should continue to self-isolate and use infection control measures (e.g., wear mask, isolate, social distance, avoid sharing personal items, clean and disinfect "high touch" surfaces, and frequent handwashing) according to CDC guidelines.   5. The patient or parent/caregiver has the option to accept or refuse COVID monoclonal antibody treatment.  After reviewing this information  with the patient, the patient has agreed to receive one of the available covid 19 monoclonal antibodies and will be provided an appropriate fact sheet prior to infusion. [name], NP 06/22/2020 9:08 AM

## 2020-06-22 NOTE — Progress Notes (Signed)
Patient reviewed Fact Sheet for Patients, Parents, and Caregivers for Emergency Use Authorization (EUA) of sotrovimab for the Treatment of Coronavirus. Patient also reviewed and is agreeable to the estimated cost of treatment. Patient is agreeable to proceed.   

## 2020-06-25 NOTE — Telephone Encounter (Signed)
error 

## 2020-07-04 ENCOUNTER — Ambulatory Visit: Payer: Medicare Other | Admitting: Internal Medicine

## 2020-08-25 DIAGNOSIS — L570 Actinic keratosis: Secondary | ICD-10-CM | POA: Diagnosis not present

## 2020-08-25 DIAGNOSIS — M5136 Other intervertebral disc degeneration, lumbar region: Secondary | ICD-10-CM | POA: Diagnosis not present

## 2020-08-25 DIAGNOSIS — R7303 Prediabetes: Secondary | ICD-10-CM | POA: Diagnosis not present

## 2020-08-25 DIAGNOSIS — I10 Essential (primary) hypertension: Secondary | ICD-10-CM | POA: Diagnosis not present

## 2020-08-25 DIAGNOSIS — Z0000 Encounter for general adult medical examination without abnormal findings: Secondary | ICD-10-CM | POA: Diagnosis not present

## 2020-08-25 DIAGNOSIS — Z1389 Encounter for screening for other disorder: Secondary | ICD-10-CM | POA: Diagnosis not present

## 2020-08-25 DIAGNOSIS — E785 Hyperlipidemia, unspecified: Secondary | ICD-10-CM | POA: Diagnosis not present

## 2020-08-25 DIAGNOSIS — I2510 Atherosclerotic heart disease of native coronary artery without angina pectoris: Secondary | ICD-10-CM | POA: Diagnosis not present

## 2020-08-25 DIAGNOSIS — Z8673 Personal history of transient ischemic attack (TIA), and cerebral infarction without residual deficits: Secondary | ICD-10-CM | POA: Diagnosis not present

## 2020-09-28 DIAGNOSIS — Z96611 Presence of right artificial shoulder joint: Secondary | ICD-10-CM | POA: Diagnosis not present

## 2020-10-11 ENCOUNTER — Ambulatory Visit: Payer: Medicare Other | Admitting: Internal Medicine

## 2020-10-11 ENCOUNTER — Encounter: Payer: Self-pay | Admitting: Internal Medicine

## 2020-10-11 ENCOUNTER — Other Ambulatory Visit: Payer: Self-pay

## 2020-10-11 VITALS — BP 170/82 | HR 55 | Ht 68.5 in | Wt 181.2 lb

## 2020-10-11 DIAGNOSIS — Z8673 Personal history of transient ischemic attack (TIA), and cerebral infarction without residual deficits: Secondary | ICD-10-CM

## 2020-10-11 DIAGNOSIS — I2510 Atherosclerotic heart disease of native coronary artery without angina pectoris: Secondary | ICD-10-CM

## 2020-10-11 DIAGNOSIS — E785 Hyperlipidemia, unspecified: Secondary | ICD-10-CM

## 2020-10-11 DIAGNOSIS — I252 Old myocardial infarction: Secondary | ICD-10-CM | POA: Diagnosis not present

## 2020-10-11 MED ORDER — REPATHA SURECLICK 140 MG/ML ~~LOC~~ SOAJ: 1.0000 | SUBCUTANEOUS | 11 refills | Status: DC

## 2020-10-11 NOTE — Patient Instructions (Signed)
Medication Instructions:  Dr. Debara Pickett recommends Repatha 140mg /mL (PCSK9). This is an injectable cholesterol medication self-administered once every 14 days. This medication will likely need prior approval with your insurance company, which we will work on. If the medication is not approved initially, we may need to do an appeal with your insurance.   Administer medication in area of fatty tissue such as abdomen, outer thigh, back of upper arm - and rotate site with each injection Store medication in refrigerator until ready to administer - allow to sit at room temp for 30 mins - 1 hour prior to injection Dispose of medication in a SHARPS container - your pharmacy should be able to direct you on this and proper disposal   If you need a co-pay card for Repatha: [email or web address]-[email or web address] >> paying for Repatha or red box that says "[place]" in top right If you need a co-pay card for Praluent: WedMap.it >> starting & paying for Praluent  Patient Assistance:  The [employer] Well foundation offers assistance to help pay for medication copays.  They will cover copays for all cholesterol lowering meds, including statins, fibrates, omega-3 fish oils like Vascepa, ezetimibe, Repatha, Praluent, Nexletol, Nexlizet.  The cards are usually good for $2,500 or 12 months, whichever comes first. 1. Go to healthwellfoundation.org 2. Click on "Apply Now" 3. Answer questions as to whom is applying (patient or representative) 4. Your disease fund will be "hypercholesterolemia - Medicare access" 5. They will ask questions about finances and which medications you are taking for cholesterol 6. When you submit, the approval is usually within minutes.  You will need to print the card information from the site 7. You will need to show this information to your pharmacy, they will bill your Medicare Part D plan first -then bill Health Well --for the copay.   You can also call them at [phone], although the hold times can be quite  long.   *If you need a refill on your cardiac medications before your next appointment, please call your pharmacy*   Lab Work: FASTING lipid panel in 3-4 months to check cholesterol   If you have labs (blood work) drawn today and your tests are completely normal, you will receive your results only by: Marland Kitchen MyChart Message (if you have MyChart) OR . A paper copy in the mail If you have any lab test that is abnormal or we need to change your treatment, we will call you to review the results.   Testing/Procedures: NONE   Follow-Up: At [facility], you and your health needs are our priority.  As part of our continuing mission to provide you with exceptional heart care, we have created designated Provider Care Teams.  These Care Teams include your primary Cardiologist (physician) and Advanced Practice Providers (APPs -  Physician Assistants and Nurse Practitioners) who all work together to provide you with the care you need, when you need it.  We recommend signing up for the patient portal called "MyChart".  Sign up information is provided on this After Visit Summary.  MyChart is used to connect with patients for Virtual Visits (Telemedicine).  Patients are able to view lab/test results, encounter notes, upcoming appointments, etc.  Non-urgent messages can be sent to your provider as well.   To learn more about what you can do with MyChart, go to [email or web address].    Your next appointment:   3-4 month(s) -- lipid clinic  The format for your next appointment:   In Person  Provider:  K. Mali Hilty, MD   Other Instructions

## 2020-10-11 NOTE — Progress Notes (Signed)
LIPID CLINIC CONSULT NOTE  Chief Complaint:  Follow-up dyslipidemia  Primary Care Physician: Wenda Low, MD  Primary Cardiologist:  None  HPI:  Jerome Duncan is a 75 y.o. male who is being seen today for the evaluation of dyslipidemia at the request of Wenda Low, MD.  This is a pleasant 75 year old male with a history of coronary artery disease previously followed by Dr. Tamala Julian at which time he underwent cardiac catheterization in 2000.  He may have had an intervention but denied any coronary stent.  He was followed up clinically after that and then released.  He also was noted to have had an old stroke at some point.  He may have also suffered some mini strokes but underwent whole-body MRI imaging because of cysts along his spine and was noted to have stroke on MRI.  He has a history of dyslipidemia and unfortunately statin intolerance.  He is previously been on atorvastatin, rosuvastatin, niacin and more recently Zyptimag.  His most recent lipid profile showed a total cholesterol 06/28/1933, HDL 44, LDL of 169 and triglycerides 120.  10/11/2020  Jerome Duncan returns today for follow-up of dyslipidemia.  Unfortunately, he was not able to get Repatha due to cost issues.  He had recently made a change to his insurance provider and saw his PCP who had recommended he come back to the lipid clinic to reevaluate his dyslipidemia.  Labs as of March 2022 showed total cholesterol 245, HDL 46, LDL 181 and triglycerides 100.  Chin he had a history of coronary disease , prior MI and stroke and prior coronary intervention therefore his target LDL is less than 61 and he has been statin intolerant stating memory issues.  Will need least 50% reduction in cholesterol which is most likely achievable with a PCSK9 inhibitor.  PMHx:  Past Medical History:  Diagnosis Date  . Anginal pain ([place])   . Complication of anesthesia   . Cyst, bone    L4-L5 spine  . Heart attack ([place])   . PONV (postoperative  nausea and vomiting)   . Stroke [facility])     Past Surgical History:  Procedure Laterality Date  . APPENDECTOMY    . CARDIAC CATHETERIZATION    . HERNIA REPAIR    . REVERSE SHOULDER ARTHROPLASTY Right 09/29/2019   Procedure: REVERSE SHOULDER ARTHROPLASTY;  Surgeon: [name], MD;  Location: [facility];  Service: Orthopedics;  Laterality: Right;  123min  . TONSILLECTOMY      FAMHx:  Family History  Problem Relation Age of Onset  . Hypertension Mother   . CAD Father     SOCHx:   reports that he quit smoking about 24 years ago. His smoking use included cigarettes. He has a 30.00 pack-year smoking history. He has never used smokeless tobacco. He reports that he does not drink alcohol and does not use drugs.  ALLERGIES:  Allergies  Allergen Reactions  . Sulfa Antibiotics Nausea Only  . Erythromycin Palpitations    ROS: Pertinent items noted in HPI and remainder of comprehensive ROS otherwise negative.  HOME MEDS: Current Outpatient Medications on File Prior to Visit  Medication Sig Dispense Refill  . acetaminophen (TYLENOL) 500 MG tablet Take 500-1,000 mg by mouth every 6 (six) hours as needed (pain.).    Marland Kitchen cyclobenzaprine (FLEXERIL) 5 MG tablet Take 1 tablet (5 mg total) by mouth 3 (three) times daily as needed for muscle spasms. 30 tablet 0  . diphenhydramine-acetaminophen (TYLENOL PM) 25-500 MG TABS tablet Take 1 tablet  by mouth at bedtime.    . Evolocumab (REPATHA SURECLICK) 419 MG/ML SOAJ Inject 1 Dose into the skin every 14 (fourteen) days. 6 mL 3  . metoprolol succinate (TOPROL-XL) 50 MG 24 hr tablet metoprolol succinate ER 50 mg tablet,extended release 24 hr     No current facility-administered medications on file prior to visit.    LABS/IMAGING: No results found for this or any previous visit (from the past 48 hour(s)). No results found.  LIPID PANEL: No results found for: CHOL, TRIG, HDL, CHOLHDL, VLDL, LDLCALC, LDLDIRECT  WEIGHTS: Wt Readings from Last 3  Encounters:  10/11/20 181 lb 3.2 oz (82.2 kg)  03/30/20 183 lb (83 kg)  09/29/19 178 lb (80.7 kg)    VITALS: BP (!) 170/82 (BP Location: Left Arm, Patient Position: Sitting, Cuff Size: Normal)   Pulse (!) 55   Ht 5' 8.5" (1.74 m)   Wt 181 lb 3.2 oz (82.2 kg)   BMI 27.15 kg/m   EXAM: General appearance: alert and no distress Neck: no carotid bruit, no JVD and thyroid not enlarged, symmetric, no tenderness/mass/nodules Lungs: clear to auscultation bilaterally Heart: regular rate and rhythm Abdomen: soft, non-tender; bowel sounds normal; no masses,  no organomegaly Extremities: extremities normal, atraumatic, no cyanosis or edema Pulses: 2+ and symmetric Skin: Skin color, texture, turgor normal. No rashes or lesions Neurologic: Grossly normal Psych: Pleasant  EKG: Sinus bradycardia 55-personally reviewed  ASSESSMENT: 1. Mixed dyslipidemia, goal LDL less than 70 2. Statin intolerance-memory loss, myalgias 3. History of coronary artery disease with MI in the 1990s 4. History of subclinical stroke 5. Hypertension  PLAN: 1.   Mr. Gauthreaux unfortunately could not afford PCSK9 inhibitor initially with very poor insurance coverage however he has switched providers and feels that it may be better covered.  We will go ahead and try to [place] and if it is affordable for him encouraged him to start taking it with the plan to follow-up with me in 3 to 4 months with a repeat lipid profile.   Pixie Casino, MD, [facility], [place] Director of the Advanced Lipid Disorders &  Cardiovascular Risk Reduction Clinic Diplomate of the American Board of Clinical Lipidology Attending Cardiologist  Direct Dial: [phone]  Fax: [phone]  Website:  [email or web address] [email or web address] 10/11/2020, 4:03 PM

## 2020-10-13 ENCOUNTER — Telehealth: Payer: Self-pay | Admitting: Internal Medicine

## 2020-10-13 NOTE — Telephone Encounter (Signed)
Attempted PA for repatha sureclick  Message received: This medication or product was previously approved on [id number] from 2020-05-28 to 2021-05-27

## 2020-11-16 DIAGNOSIS — C44319 Basal cell carcinoma of skin of other parts of face: Secondary | ICD-10-CM | POA: Diagnosis not present

## 2020-11-16 DIAGNOSIS — C4441 Basal cell carcinoma of skin of scalp and neck: Secondary | ICD-10-CM | POA: Diagnosis not present

## 2020-11-16 DIAGNOSIS — L570 Actinic keratosis: Secondary | ICD-10-CM | POA: Diagnosis not present

## 2020-11-16 DIAGNOSIS — D485 Neoplasm of uncertain behavior of skin: Secondary | ICD-10-CM | POA: Diagnosis not present

## 2020-11-16 DIAGNOSIS — L821 Other seborrheic keratosis: Secondary | ICD-10-CM | POA: Diagnosis not present

## 2020-11-16 DIAGNOSIS — D225 Melanocytic nevi of trunk: Secondary | ICD-10-CM | POA: Diagnosis not present

## 2020-12-13 DIAGNOSIS — L988 Other specified disorders of the skin and subcutaneous tissue: Secondary | ICD-10-CM | POA: Diagnosis not present

## 2020-12-13 DIAGNOSIS — D485 Neoplasm of uncertain behavior of skin: Secondary | ICD-10-CM | POA: Diagnosis not present

## 2020-12-20 DIAGNOSIS — C44319 Basal cell carcinoma of skin of other parts of face: Secondary | ICD-10-CM | POA: Diagnosis not present

## 2020-12-27 DIAGNOSIS — C4441 Basal cell carcinoma of skin of scalp and neck: Secondary | ICD-10-CM | POA: Diagnosis not present

## 2020-12-27 DIAGNOSIS — L570 Actinic keratosis: Secondary | ICD-10-CM | POA: Diagnosis not present

## 2021-01-16 DIAGNOSIS — I2510 Atherosclerotic heart disease of native coronary artery without angina pectoris: Secondary | ICD-10-CM | POA: Diagnosis not present

## 2021-01-17 LAB — LIPID PANEL
Chol/HDL Ratio: 3.4 ratio (ref 0.0–5.0)
Cholesterol, Total: 147 mg/dL (ref 100–199)
HDL: 43 mg/dL (ref >39)
LDL Chol Calc (NIH): 74 mg/dL (ref 0–99)
Triglycerides: 178 mg/dL — ABNORMAL HIGH (ref 0–149)
VLDL Cholesterol Cal: 30 mg/dL (ref 5–40)

## 2021-01-25 ENCOUNTER — Other Ambulatory Visit: Payer: Self-pay

## 2021-01-25 ENCOUNTER — Ambulatory Visit (HOSPITAL_BASED_OUTPATIENT_CLINIC_OR_DEPARTMENT_OTHER): Payer: Medicare Other | Admitting: Internal Medicine

## 2021-01-25 ENCOUNTER — Encounter (HOSPITAL_BASED_OUTPATIENT_CLINIC_OR_DEPARTMENT_OTHER): Payer: Self-pay | Admitting: Internal Medicine

## 2021-01-25 VITALS — BP 138/84 | HR 68 | Ht 68.5 in | Wt 182.0 lb

## 2021-01-25 DIAGNOSIS — E785 Hyperlipidemia, unspecified: Secondary | ICD-10-CM

## 2021-01-25 DIAGNOSIS — Z8673 Personal history of transient ischemic attack (TIA), and cerebral infarction without residual deficits: Secondary | ICD-10-CM | POA: Diagnosis not present

## 2021-01-25 DIAGNOSIS — I252 Old myocardial infarction: Secondary | ICD-10-CM

## 2021-01-25 DIAGNOSIS — T466X5D Adverse effect of antihyperlipidemic and antiarteriosclerotic drugs, subsequent encounter: Secondary | ICD-10-CM

## 2021-01-25 DIAGNOSIS — I2510 Atherosclerotic heart disease of native coronary artery without angina pectoris: Secondary | ICD-10-CM | POA: Diagnosis not present

## 2021-01-25 DIAGNOSIS — M791 Myalgia, unspecified site: Secondary | ICD-10-CM

## 2021-01-25 DIAGNOSIS — T466X5A Adverse effect of antihyperlipidemic and antiarteriosclerotic drugs, initial encounter: Secondary | ICD-10-CM

## 2021-01-25 NOTE — Progress Notes (Signed)
LIPID CLINIC CONSULT NOTE  Chief Complaint:  Follow-up dyslipidemia  Primary Care Physician: Wenda Low, MD  Primary Cardiologist:  None  HPI:  Jerome Duncan is a 75 y.o. male who is being seen today for the evaluation of dyslipidemia at the request of Wenda Low, MD.  This is a pleasant 75 year old male with a history of coronary artery disease previously followed by Dr. Tamala Julian at which time he underwent cardiac catheterization in 2000.  He may have had an intervention but denied any coronary stent.  He was followed up clinically after that and then released.  He also was noted to have had an old stroke at some point.  He may have also suffered some mini strokes but underwent whole-body MRI imaging because of cysts along his spine and was noted to have stroke on MRI.  He has a history of dyslipidemia and unfortunately statin intolerance.  He is previously been on atorvastatin, rosuvastatin, niacin and more recently Zyptimag.  His most recent lipid profile showed a total cholesterol 06/28/1933, HDL 44, LDL of 169 and triglycerides 120.  10/11/2020  Jerome Duncan returns today for follow-up of dyslipidemia.  Unfortunately, he was not able to get Repatha due to cost issues.  He had recently made a change to his insurance provider and saw his PCP who had recommended he come back to the lipid clinic to reevaluate his dyslipidemia.  Labs as of March 2022 showed total cholesterol 245, HDL 46, LDL 181 and triglycerides 100.  Chin he had a history of coronary disease , prior MI and stroke and prior coronary intervention therefore his target LDL is less than 80 and he has been statin intolerant stating memory issues.  Will need least 50% reduction in cholesterol which is most likely achievable with a PCSK9 inhibitor.  01/25/2021  Jerome Duncan returns today for follow-up.  He was able to start on Repatha.  He says he is currently pain about $47 a month.  He tolerates the medicine well.  He has had a  marked reduction in his lipids with total cholesterol now 147, triglycerides 178, HDL 43 and LDL 74 that is down from an LDL of 181 in the past.  Triglycerides are little bit higher.  We talked about importance of continue to mind his diet.  Physical activity would be important as well.  Overall the response however has been very good to Repatha.  PMHx:  Past Medical History:  Diagnosis Date   Anginal pain ([place])    Complication of anesthesia    Cyst, bone    L4-L5 spine   Heart attack ([facility])    PONV (postoperative nausea and vomiting)    Stroke [facility])     Past Surgical History:  Procedure Laterality Date   APPENDECTOMY     CARDIAC CATHETERIZATION     HERNIA REPAIR     REVERSE SHOULDER ARTHROPLASTY Right 09/29/2019   Procedure: REVERSE SHOULDER ARTHROPLASTY;  Surgeon: [name], MD;  Location: [facility];  Service: Orthopedics;  Laterality: Right;  159mn   TONSILLECTOMY      FAMHx:  Family History  Problem Relation Age of Onset   Hypertension Mother    CAD Father     SOCHx:   reports that he quit smoking about 24 years ago. His smoking use included cigarettes. He has a 30.00 pack-year smoking history. He has never used smokeless tobacco. He reports that he does not drink alcohol and does not use drugs.  ALLERGIES:  Allergies  Allergen  Reactions   Sulfa Antibiotics Nausea Only   Erythromycin Palpitations    ROS: Pertinent items noted in HPI and remainder of comprehensive ROS otherwise negative.  HOME MEDS: Current Outpatient Medications on File Prior to Visit  Medication Sig Dispense Refill   acetaminophen (TYLENOL) 500 MG tablet Take 500-1,000 mg by mouth every 6 (six) hours as needed (pain.).     cyclobenzaprine (FLEXERIL) 5 MG tablet Take 1 tablet (5 mg total) by mouth 3 (three) times daily as needed for muscle spasms. 30 tablet 0   diphenhydramine-acetaminophen (TYLENOL PM) 25-500 MG TABS tablet Take 1 tablet by mouth at bedtime.     Evolocumab (REPATHA SURECLICK) [id number]  MG/ML SOAJ Inject 1 Dose into the skin every 14 (fourteen) days. 2 mL 11   metoprolol succinate (TOPROL-XL) 50 MG 24 hr tablet Take 25 mg by mouth daily.     No current facility-administered medications on file prior to visit.    LABS/IMAGING: No results found for this or any previous visit (from the past 48 hour(s)). No results found.  LIPID PANEL:    Component Value Date/Time   CHOL 147 01/16/2021 0906   TRIG 178 (H) 01/16/2021 0906   HDL 43 01/16/2021 0906   CHOLHDL 3.4 01/16/2021 0906   LDLCALC 74 01/16/2021 0906    WEIGHTS: Wt Readings from Last 3 Encounters:  01/25/21 182 lb (82.6 kg)  10/11/20 181 lb 3.2 oz (82.2 kg)  03/30/20 183 lb (83 kg)    VITALS: BP 138/84   Pulse 68   Ht 5' 8.5" (1.74 m)   Wt 182 lb (82.6 kg)   SpO2 96%   BMI 27.27 kg/m   EXAM: Deferred  EKG: N/A  ASSESSMENT: Mixed dyslipidemia, goal LDL less than 70 Statin intolerance-memory loss, myalgias History of coronary artery disease with MI in the 1990s History of subclinical stroke Hypertension  PLAN: 1.   Jerome Duncan has responded well to [place].  His lipids are much better controlled with a slightly elevated triglyceride.  They had been lower in the past.  He will need to continue to monitor his diet.  He does have history of subclinical stroke and distant or remote MI.  He is not on aspirin per his choice due to easy bruising.  This would be recommended given his history.  Continue his current medications.  We will repeat a lipid in the year and plan follow-up at that time.  I advised him to consider applying for the health well grant as of January as it may be refunded.  Pixie Casino, MD, [facility], [place] Director of the Advanced Lipid Disorders &  Cardiovascular Risk Reduction Clinic Diplomate of the American Board of Clinical Lipidology Attending Cardiologist  Direct Dial: [phone]  Fax: [phone]  Website:   [email or web address] [email or web address] 01/25/2021, 8:02 AM

## 2021-01-25 NOTE — Patient Instructions (Signed)
Medication Instructions:  Your physician recommends that you continue on your current medications as directed. Please refer to the Current Medication list given to you today.  *If you need a refill on your cardiac medications before your next appointment, please call your pharmacy*   Lab Work: FASTING lab work in 1 year to check cholesterol  -- complete about 1 week before your next visit with Dr. Debara Pickett   If you have labs (blood work) drawn today and your tests are completely normal, you will receive your results only by: [place] (if you have MyChart) OR A paper copy in the mail If you have any lab test that is abnormal or we need to change your treatment, we will call you to review the results.   Testing/Procedures: NONE   Follow-Up: At [facility], you and your health needs are our priority.  As part of our continuing mission to provide you with exceptional heart care, we have created designated Provider Care Teams.  These Care Teams include your primary Cardiologist (physician) and Advanced Practice Providers (APPs -  Physician Assistants and Nurse Practitioners) who all work together to provide you with the care you need, when you need it.  We recommend signing up for the patient portal called "MyChart".  Sign up information is provided on this After Visit Summary.  MyChart is used to connect with patients for Virtual Visits (Telemedicine).  Patients are able to view lab/test results, encounter notes, upcoming appointments, etc.  Non-urgent messages can be sent to your provider as well.   To learn more about what you can do with MyChart, go to [email or web address].    Your next appointment:   12 month(s) - lipid clinic  The format for your next appointment:   In Person  Provider:   K. Mali Hilty, MD   Other Instructions

## 2021-05-23 ENCOUNTER — Telehealth: Payer: Self-pay

## 2021-05-23 NOTE — Telephone Encounter (Signed)
PA for Repatha submitted via [place], Key: [id number]

## 2021-05-24 NOTE — Telephone Encounter (Signed)
Patient Approved for Repatha [id number] through 05/27/2022.

## 2021-06-01 ENCOUNTER — Other Ambulatory Visit: Payer: Self-pay

## 2021-06-01 MED ORDER — REPATHA SURECLICK 140 MG/ML ~~LOC~~ SOAJ: 1.0000 | SUBCUTANEOUS | 3 refills | Status: DC

## 2021-07-10 DIAGNOSIS — L814 Other melanin hyperpigmentation: Secondary | ICD-10-CM | POA: Diagnosis not present

## 2021-07-10 DIAGNOSIS — D692 Other nonthrombocytopenic purpura: Secondary | ICD-10-CM | POA: Diagnosis not present

## 2021-07-10 DIAGNOSIS — L821 Other seborrheic keratosis: Secondary | ICD-10-CM | POA: Diagnosis not present

## 2021-07-10 DIAGNOSIS — D225 Melanocytic nevi of trunk: Secondary | ICD-10-CM | POA: Diagnosis not present

## 2021-07-10 DIAGNOSIS — Z85828 Personal history of other malignant neoplasm of skin: Secondary | ICD-10-CM | POA: Diagnosis not present

## 2021-07-10 DIAGNOSIS — L570 Actinic keratosis: Secondary | ICD-10-CM | POA: Diagnosis not present

## 2021-07-10 DIAGNOSIS — D485 Neoplasm of uncertain behavior of skin: Secondary | ICD-10-CM | POA: Diagnosis not present

## 2021-07-31 DIAGNOSIS — M5116 Intervertebral disc disorders with radiculopathy, lumbar region: Secondary | ICD-10-CM | POA: Diagnosis not present

## 2021-07-31 DIAGNOSIS — M4316 Spondylolisthesis, lumbar region: Secondary | ICD-10-CM | POA: Diagnosis not present

## 2021-07-31 DIAGNOSIS — M549 Dorsalgia, unspecified: Secondary | ICD-10-CM | POA: Diagnosis not present

## 2021-07-31 DIAGNOSIS — M5416 Radiculopathy, lumbar region: Secondary | ICD-10-CM | POA: Diagnosis not present

## 2021-07-31 DIAGNOSIS — M545 Low back pain, unspecified: Secondary | ICD-10-CM | POA: Diagnosis not present

## 2021-07-31 DIAGNOSIS — M4726 Other spondylosis with radiculopathy, lumbar region: Secondary | ICD-10-CM | POA: Diagnosis not present

## 2021-07-31 DIAGNOSIS — R531 Weakness: Secondary | ICD-10-CM | POA: Diagnosis not present

## 2021-08-04 DIAGNOSIS — L988 Other specified disorders of the skin and subcutaneous tissue: Secondary | ICD-10-CM | POA: Diagnosis not present

## 2021-08-04 DIAGNOSIS — D485 Neoplasm of uncertain behavior of skin: Secondary | ICD-10-CM | POA: Diagnosis not present

## 2021-08-13 DIAGNOSIS — M48061 Spinal stenosis, lumbar region without neurogenic claudication: Secondary | ICD-10-CM | POA: Diagnosis not present

## 2021-08-13 DIAGNOSIS — R531 Weakness: Secondary | ICD-10-CM | POA: Diagnosis not present

## 2021-08-13 DIAGNOSIS — M5117 Intervertebral disc disorders with radiculopathy, lumbosacral region: Secondary | ICD-10-CM | POA: Diagnosis not present

## 2021-08-13 DIAGNOSIS — M5416 Radiculopathy, lumbar region: Secondary | ICD-10-CM | POA: Diagnosis not present

## 2021-08-30 DIAGNOSIS — D692 Other nonthrombocytopenic purpura: Secondary | ICD-10-CM | POA: Diagnosis not present

## 2021-08-30 DIAGNOSIS — M48061 Spinal stenosis, lumbar region without neurogenic claudication: Secondary | ICD-10-CM | POA: Diagnosis not present

## 2021-08-30 DIAGNOSIS — E782 Mixed hyperlipidemia: Secondary | ICD-10-CM | POA: Diagnosis not present

## 2021-08-30 DIAGNOSIS — Z8673 Personal history of transient ischemic attack (TIA), and cerebral infarction without residual deficits: Secondary | ICD-10-CM | POA: Diagnosis not present

## 2021-08-30 DIAGNOSIS — R7303 Prediabetes: Secondary | ICD-10-CM | POA: Diagnosis not present

## 2021-08-30 DIAGNOSIS — I2510 Atherosclerotic heart disease of native coronary artery without angina pectoris: Secondary | ICD-10-CM | POA: Diagnosis not present

## 2021-08-30 DIAGNOSIS — G720 Drug-induced myopathy: Secondary | ICD-10-CM | POA: Diagnosis not present

## 2021-08-30 DIAGNOSIS — Z1389 Encounter for screening for other disorder: Secondary | ICD-10-CM | POA: Diagnosis not present

## 2021-08-30 DIAGNOSIS — I10 Essential (primary) hypertension: Secondary | ICD-10-CM | POA: Diagnosis not present

## 2021-08-30 DIAGNOSIS — Z0000 Encounter for general adult medical examination without abnormal findings: Secondary | ICD-10-CM | POA: Diagnosis not present

## 2021-09-24 DIAGNOSIS — L0291 Cutaneous abscess, unspecified: Secondary | ICD-10-CM | POA: Diagnosis not present

## 2021-09-29 DIAGNOSIS — L0201 Cutaneous abscess of face: Secondary | ICD-10-CM | POA: Diagnosis not present

## 2022-01-22 ENCOUNTER — Ambulatory Visit: Payer: Self-pay

## 2022-01-22 NOTE — Patient Outreach (Signed)
  Care Coordination   Initial Visit Note   01/22/2022 Name: Jerome Duncan MRN: [id number] DOB: 1945/09/07  Jerome Duncan is a 76 y.o. year old male who sees Jerome Low, MD for primary care. I spoke with  Jerome Duncan by phone today.  What matters to the patients health and wellness today?  Health Maintenance        SDOH assessments and interventions completed:  Yes  SDOH Interventions Today    Flowsheet Row Most Recent Value  SDOH Interventions   Food Insecurity Interventions Intervention Not Indicated  Transportation Interventions Intervention Not Indicated        Care Coordination Interventions Activated:  Yes  Care Coordination Interventions:  Yes, provided   Follow up plan:  Will follow up with the [facility] team regarding medications.                               Jerome Duncan will call for care coordination assistance as needed.  Encounter Outcome:  Pt. Visit Completed   [place] [facility] [phone]

## 2022-02-11 DIAGNOSIS — R21 Rash and other nonspecific skin eruption: Secondary | ICD-10-CM | POA: Diagnosis not present

## 2022-02-14 DIAGNOSIS — L570 Actinic keratosis: Secondary | ICD-10-CM | POA: Diagnosis not present

## 2022-03-22 ENCOUNTER — Other Ambulatory Visit: Payer: Self-pay | Admitting: Internal Medicine

## 2022-07-19 DIAGNOSIS — D225 Melanocytic nevi of trunk: Secondary | ICD-10-CM | POA: Diagnosis not present

## 2022-07-19 DIAGNOSIS — L821 Other seborrheic keratosis: Secondary | ICD-10-CM | POA: Diagnosis not present

## 2022-07-19 DIAGNOSIS — D2261 Melanocytic nevi of right upper limb, including shoulder: Secondary | ICD-10-CM | POA: Diagnosis not present

## 2022-07-19 DIAGNOSIS — D692 Other nonthrombocytopenic purpura: Secondary | ICD-10-CM | POA: Diagnosis not present

## 2022-07-19 DIAGNOSIS — Z85828 Personal history of other malignant neoplasm of skin: Secondary | ICD-10-CM | POA: Diagnosis not present

## 2022-07-19 DIAGNOSIS — C44222 Squamous cell carcinoma of skin of right ear and external auricular canal: Secondary | ICD-10-CM | POA: Diagnosis not present

## 2022-07-19 DIAGNOSIS — L570 Actinic keratosis: Secondary | ICD-10-CM | POA: Diagnosis not present

## 2022-07-19 DIAGNOSIS — D1801 Hemangioma of skin and subcutaneous tissue: Secondary | ICD-10-CM | POA: Diagnosis not present

## 2022-08-28 DIAGNOSIS — E782 Mixed hyperlipidemia: Secondary | ICD-10-CM | POA: Diagnosis not present

## 2022-08-28 DIAGNOSIS — I10 Essential (primary) hypertension: Secondary | ICD-10-CM | POA: Diagnosis not present

## 2022-08-28 DIAGNOSIS — R7303 Prediabetes: Secondary | ICD-10-CM | POA: Diagnosis not present

## 2022-09-04 DIAGNOSIS — Z1389 Encounter for screening for other disorder: Secondary | ICD-10-CM | POA: Diagnosis not present

## 2022-09-04 DIAGNOSIS — I2510 Atherosclerotic heart disease of native coronary artery without angina pectoris: Secondary | ICD-10-CM | POA: Diagnosis not present

## 2022-09-04 DIAGNOSIS — E782 Mixed hyperlipidemia: Secondary | ICD-10-CM | POA: Diagnosis not present

## 2022-09-04 DIAGNOSIS — R7303 Prediabetes: Secondary | ICD-10-CM | POA: Diagnosis not present

## 2022-09-04 DIAGNOSIS — I10 Essential (primary) hypertension: Secondary | ICD-10-CM | POA: Diagnosis not present

## 2022-09-04 DIAGNOSIS — M48061 Spinal stenosis, lumbar region without neurogenic claudication: Secondary | ICD-10-CM | POA: Diagnosis not present

## 2022-09-04 DIAGNOSIS — Z23 Encounter for immunization: Secondary | ICD-10-CM | POA: Diagnosis not present

## 2022-09-04 DIAGNOSIS — Z9181 History of falling: Secondary | ICD-10-CM | POA: Diagnosis not present

## 2022-09-04 DIAGNOSIS — G720 Drug-induced myopathy: Secondary | ICD-10-CM | POA: Diagnosis not present

## 2022-09-04 DIAGNOSIS — Z0000 Encounter for general adult medical examination without abnormal findings: Secondary | ICD-10-CM | POA: Diagnosis not present

## 2022-09-17 ENCOUNTER — Ambulatory Visit: Payer: Medicare Other | Attending: Internal Medicine | Admitting: Internal Medicine

## 2022-09-17 VITALS — BP 132/80 | HR 76 | Ht 68.5 in | Wt 179.6 lb

## 2022-09-17 DIAGNOSIS — Z8673 Personal history of transient ischemic attack (TIA), and cerebral infarction without residual deficits: Secondary | ICD-10-CM | POA: Diagnosis not present

## 2022-09-17 DIAGNOSIS — I252 Old myocardial infarction: Secondary | ICD-10-CM | POA: Diagnosis not present

## 2022-09-17 DIAGNOSIS — M791 Myalgia, unspecified site: Secondary | ICD-10-CM

## 2022-09-17 DIAGNOSIS — E785 Hyperlipidemia, unspecified: Secondary | ICD-10-CM | POA: Diagnosis not present

## 2022-09-17 DIAGNOSIS — T466X5A Adverse effect of antihyperlipidemic and antiarteriosclerotic drugs, initial encounter: Secondary | ICD-10-CM

## 2022-09-17 MED ORDER — REPATHA SURECLICK 140 MG/ML ~~LOC~~ SOAJ: SUBCUTANEOUS | 3 refills | Status: DC

## 2022-09-17 NOTE — Patient Instructions (Signed)
Medication Instructions:  NO CHANGES  *If you need a refill on your cardiac medications before your next appointment, please call your pharmacy*   Lab Work: FASTING lipid panel in 1 year   If you have labs (blood work) drawn today and your tests are completely normal, you will receive your results only by: MyChart Message (if you have MyChart) OR A paper copy in the mail If you have any lab test that is abnormal or we need to change your treatment, we will call you to review the results.   Follow-Up: At [facility], you and your health needs are our priority.  As part of our continuing mission to provide you with exceptional heart care, we have created designated Provider Care Teams.  These Care Teams include your primary Cardiologist (physician) and Advanced Practice Providers (APPs -  Physician Assistants and Nurse Practitioners) who all work together to provide you with the care you need, when you need it.  We recommend signing up for the patient portal called "MyChart".  Sign up information is provided on this After Visit Summary.  MyChart is used to connect with patients for Virtual Visits (Telemedicine).  Patients are able to view lab/test results, encounter notes, upcoming appointments, etc.  Non-urgent messages can be sent to your provider as well.   To learn more about what you can do with MyChart, go to [email or web address].    Your next appointment:    12 months with Dr. Hilty 

## 2022-09-17 NOTE — Progress Notes (Signed)
LIPID CLINIC CONSULT NOTE  Chief Complaint:  Follow-up dyslipidemia  Primary Care Physician: Georgann Housekeeper, MD  Primary Cardiologist:  None  HPI:  Jerome Duncan is a 77 y.o. male who is being seen today for the evaluation of dyslipidemia at the request of Georgann Housekeeper, MD.  This is a pleasant 77 year old male with a history of coronary artery disease previously followed by Dr. Katrinka Blazing at which time he underwent cardiac catheterization in 2000.  He may have had an intervention but denied any coronary stent.  He was followed up clinically after that and then released.  He also was noted to have had an old stroke at some point.  He may have also suffered some mini strokes but underwent whole-body MRI imaging because of cysts along his spine and was noted to have stroke on MRI.  He has a history of dyslipidemia and unfortunately statin intolerance.  He is previously been on atorvastatin, rosuvastatin, niacin and more recently Zyptimag.  His most recent lipid profile showed a total cholesterol 06/28/1933, HDL 44, LDL of 169 and triglycerides 120.  10/11/2020  Mr. Fluke returns today for follow-up of dyslipidemia.  Unfortunately, he was not able to get Repatha due to cost issues.  He had recently made a change to his insurance provider and saw his PCP who had recommended he come back to the lipid clinic to reevaluate his dyslipidemia.  Labs as of March 2022 showed total cholesterol 245, HDL 46, LDL 181 and triglycerides 161.  Chin he had a history of coronary disease , prior MI and stroke and prior coronary intervention therefore his target LDL is less than 70 and he has been statin intolerant stating memory issues.  Will need least 50% reduction in cholesterol which is most likely achievable with a PCSK9 inhibitor.  01/25/2021  Mr. Brod returns today for follow-up.  He was able to start on Repatha.  He says he is currently pain about $47 a month.  He tolerates the medicine well.  He has had a  marked reduction in his lipids with total cholesterol now 147, triglycerides 178, HDL 43 and LDL 74 that is down from an LDL of 181 in the past.  Triglycerides are little bit higher.  We talked about importance of continue to mind his diet.  Physical activity would be important as well.  Overall the response however has been very good to Repatha.  09/17/2022  Mr. Mrozek is seen today in follow-up.  He continues to do well on therapy.  It has been 2 years since I last saw him.  He remains on Repatha.  Total cholesterol now 112, triglycerides 116, HDL 44 and LDL 47.  PMHx:  Past Medical History:  Diagnosis Date   Anginal pain ([facility])    Complication of anesthesia    Cyst, bone    L4-L5 spine   Heart attack ([facility])    PONV (postoperative nausea and vomiting)    Stroke [facility])     Past Surgical History:  Procedure Laterality Date   APPENDECTOMY     CARDIAC CATHETERIZATION     HERNIA REPAIR     REVERSE SHOULDER ARTHROPLASTY Right 09/29/2019   Procedure: REVERSE SHOULDER ARTHROPLASTY;  Surgeon: Francena Hanly, MD;  Location: [facility];  Service: Orthopedics;  Laterality: Right;    TONSILLECTOMY      FAMHx:  Family History  Problem Relation Age of Onset   Hypertension Mother    CAD Father     SOCHx:  reports that he quit smoking about 26 years ago. His smoking use included cigarettes. He has a 30.00 pack-year smoking history. He has never used smokeless tobacco. He reports that he does not drink alcohol and does not use drugs.  ALLERGIES:  Allergies  Allergen Reactions   Sulfa Antibiotics Nausea Only   Erythromycin Palpitations    ROS: Pertinent items noted in HPI and remainder of comprehensive ROS otherwise negative.  HOME MEDS: Current Outpatient Medications on File Prior to Visit  Medication Sig Dispense Refill   acetaminophen (TYLENOL) 500 MG tablet Take 500-1,000 mg by mouth every 6 (six) hours as needed (pain.).     cyclobenzaprine (FLEXERIL) 5 MG tablet Take 1 tablet (5  mg total) by mouth 3 (three) times daily as needed for muscle spasms. 30 tablet 0   diphenhydramine-acetaminophen (TYLENOL PM) 25-500 MG TABS tablet Take 1 tablet by mouth at bedtime.     metoprolol succinate (TOPROL-XL) 50 MG 24 hr tablet Take 25 mg by mouth daily.     REPATHA SURECLICK 140 MG/ML SOAJ INJECT  SUBCUTANEOUSLY  EVERY 2 WEEKS 6 mL 3   No current facility-administered medications on file prior to visit.    LABS/IMAGING: No results found for this or any previous visit (from the past 48 hour(s)). No results found.  LIPID PANEL:    Component Value Date/Time   CHOL 147 01/16/2021 0906   TRIG 178 (H) 01/16/2021 0906   HDL 43 01/16/2021 0906   CHOLHDL 3.4 01/16/2021 0906   LDLCALC 74 01/16/2021 0906    WEIGHTS: Wt Readings from Last 3 Encounters:  09/17/22 179 lb 9.6 oz (81.5 kg)  01/25/21 182 lb (82.6 kg)  10/11/20 181 lb 3.2 oz (82.2 kg)    VITALS: BP 132/80 (BP Location: Right Arm, Patient Position: Sitting, Cuff Size: Normal)   Pulse 76   Ht 5' 8.5" (1.74 m)   Wt 179 lb 9.6 oz (81.5 kg)   SpO2 96%   BMI 26.91 kg/m   EXAM: Deferred  EKG: N/A  ASSESSMENT: Mixed dyslipidemia, goal LDL less than 70 Statin intolerance-memory loss, myalgias History of coronary artery disease with MI in the 1990s History of subclinical stroke Hypertension  PLAN: 1.   Mr. Mcpartlin continues to have significant improvement in his lipids and is tolerating Repatha without any issues.  He denies any chest pain or worsening shortness of breath.  He remains at target LDL less than 70.  No changes to medicines today.  Plan follow-up annually or sooner as necessary.  Chrystie Nose, MD, [facility], FACP  [place]  [facility]  Medical Director of the Advanced Lipid Disorders &  Cardiovascular Risk Reduction Clinic Diplomate of the American Board of Clinical Lipidology Attending Cardiologist  Direct Dial: [phone]  Fax: [phone]  Website:   [email or web address] 09/17/2022, 10:53 AM

## 2022-10-24 DIAGNOSIS — M25512 Pain in left shoulder: Secondary | ICD-10-CM | POA: Diagnosis not present

## 2022-12-03 DIAGNOSIS — R7303 Prediabetes: Secondary | ICD-10-CM | POA: Diagnosis not present

## 2022-12-04 DIAGNOSIS — H40013 Open angle with borderline findings, low risk, bilateral: Secondary | ICD-10-CM | POA: Diagnosis not present

## 2022-12-18 ENCOUNTER — Telehealth: Payer: Medicare Other | Admitting: Internal Medicine

## 2023-01-02 DIAGNOSIS — M25512 Pain in left shoulder: Secondary | ICD-10-CM | POA: Diagnosis not present

## 2023-01-02 DIAGNOSIS — Z96611 Presence of right artificial shoulder joint: Secondary | ICD-10-CM | POA: Diagnosis not present

## 2023-02-07 ENCOUNTER — Other Ambulatory Visit: Payer: Self-pay | Admitting: Internal Medicine

## 2023-03-06 DIAGNOSIS — M47816 Spondylosis without myelopathy or radiculopathy, lumbar region: Secondary | ICD-10-CM | POA: Diagnosis not present

## 2023-03-06 DIAGNOSIS — I10 Essential (primary) hypertension: Secondary | ICD-10-CM | POA: Diagnosis not present

## 2023-03-06 DIAGNOSIS — G720 Drug-induced myopathy: Secondary | ICD-10-CM | POA: Diagnosis not present

## 2023-03-06 DIAGNOSIS — R7303 Prediabetes: Secondary | ICD-10-CM | POA: Diagnosis not present

## 2023-03-06 DIAGNOSIS — E782 Mixed hyperlipidemia: Secondary | ICD-10-CM | POA: Diagnosis not present

## 2023-03-06 DIAGNOSIS — I2510 Atherosclerotic heart disease of native coronary artery without angina pectoris: Secondary | ICD-10-CM | POA: Diagnosis not present

## 2023-03-06 DIAGNOSIS — Z23 Encounter for immunization: Secondary | ICD-10-CM | POA: Diagnosis not present

## 2023-03-06 DIAGNOSIS — M25512 Pain in left shoulder: Secondary | ICD-10-CM | POA: Diagnosis not present

## 2023-03-06 DIAGNOSIS — Z8673 Personal history of transient ischemic attack (TIA), and cerebral infarction without residual deficits: Secondary | ICD-10-CM | POA: Diagnosis not present

## 2023-03-06 DIAGNOSIS — Z1389 Encounter for screening for other disorder: Secondary | ICD-10-CM | POA: Diagnosis not present

## 2023-07-22 DIAGNOSIS — D1801 Hemangioma of skin and subcutaneous tissue: Secondary | ICD-10-CM | POA: Diagnosis not present

## 2023-07-22 DIAGNOSIS — L821 Other seborrheic keratosis: Secondary | ICD-10-CM | POA: Diagnosis not present

## 2023-07-22 DIAGNOSIS — L814 Other melanin hyperpigmentation: Secondary | ICD-10-CM | POA: Diagnosis not present

## 2023-07-22 DIAGNOSIS — D692 Other nonthrombocytopenic purpura: Secondary | ICD-10-CM | POA: Diagnosis not present

## 2023-07-22 DIAGNOSIS — Z85828 Personal history of other malignant neoplasm of skin: Secondary | ICD-10-CM | POA: Diagnosis not present

## 2023-07-22 DIAGNOSIS — D0421 Carcinoma in situ of skin of right ear and external auricular canal: Secondary | ICD-10-CM | POA: Diagnosis not present

## 2023-07-22 DIAGNOSIS — C44329 Squamous cell carcinoma of skin of other parts of face: Secondary | ICD-10-CM | POA: Diagnosis not present

## 2023-07-22 DIAGNOSIS — L570 Actinic keratosis: Secondary | ICD-10-CM | POA: Diagnosis not present

## 2023-08-06 ENCOUNTER — Telehealth: Payer: Self-pay | Admitting: Internal Medicine

## 2023-08-06 NOTE — Telephone Encounter (Signed)
   Pre-operative Risk Assessment    Patient Name: Jerome Duncan  DOB: 05-Aug-1945 MRN: [id number]   Date of last office visit: 09/17/2022 Date of next office visit: 09/24/2023   Request for Surgical Clearance    Procedure:   left reverse shoulder arthroplasty  Date of Surgery:  Clearance TBD                                Surgeon:  Dr. Francena Hanly Surgeon's Group or Practice Name:  [facility] Phone number:  [phone] Fax number:  [phone]   Type of Clearance Requested:   - Medical    Type of Anesthesia:  General    Additional requests/questions:    SignedRoyann Shivers   08/06/2023, 11:59 AM

## 2023-08-07 NOTE — Telephone Encounter (Signed)
   Name: Jerome Duncan  DOB: Oct 18, 1945  MRN: [id number]  Primary Cardiologist: None  Chart reviewed as part of pre-operative protocol coverage. The patient has an upcoming visit scheduled with Dr. Rennis Golden on 09/24/2023 at which time clearance can be addressed in case there are any issues that would impact surgical recommendations.  Left reverse shoulder arthroplasty is not scheduled until TBD as below. I added preop FYI to appointment note so that provider is aware to address at time of outpatient visit.  Per office protocol the cardiology provider should forward their finalized clearance decision and recommendations regarding antiplatelet therapy to the requesting party below.    I will route this message as FYI to requesting party and remove this message from the preop box as separate preop APP input not needed at this time.   Please call with any questions.  Denyce Robert, NP  08/07/2023, 10:48 AM

## 2023-08-12 DIAGNOSIS — Z01818 Encounter for other preprocedural examination: Secondary | ICD-10-CM | POA: Diagnosis not present

## 2023-08-12 DIAGNOSIS — I2510 Atherosclerotic heart disease of native coronary artery without angina pectoris: Secondary | ICD-10-CM | POA: Diagnosis not present

## 2023-08-12 DIAGNOSIS — M25512 Pain in left shoulder: Secondary | ICD-10-CM | POA: Diagnosis not present

## 2023-08-12 DIAGNOSIS — R7303 Prediabetes: Secondary | ICD-10-CM | POA: Diagnosis not present

## 2023-08-12 DIAGNOSIS — Z8673 Personal history of transient ischemic attack (TIA), and cerebral infarction without residual deficits: Secondary | ICD-10-CM | POA: Diagnosis not present

## 2023-08-12 DIAGNOSIS — I10 Essential (primary) hypertension: Secondary | ICD-10-CM | POA: Diagnosis not present

## 2023-09-02 ENCOUNTER — Other Ambulatory Visit: Payer: Self-pay | Admitting: *Deleted

## 2023-09-02 DIAGNOSIS — E785 Hyperlipidemia, unspecified: Secondary | ICD-10-CM

## 2023-09-03 ENCOUNTER — Other Ambulatory Visit: Payer: Self-pay | Admitting: Internal Medicine

## 2023-09-03 DIAGNOSIS — E785 Hyperlipidemia, unspecified: Secondary | ICD-10-CM | POA: Diagnosis not present

## 2023-09-03 LAB — LIPID PANEL
Chol/HDL Ratio: 3.5 ratio (ref 0.0–5.0)
Cholesterol, Total: 147 mg/dL (ref 100–199)
HDL: 42 mg/dL (ref >39)
LDL Chol Calc (NIH): 83 mg/dL (ref 0–99)
Triglycerides: 121 mg/dL (ref 0–149)
VLDL Cholesterol Cal: 22 mg/dL (ref 5–40)

## 2023-09-09 ENCOUNTER — Encounter: Payer: Self-pay | Admitting: Cardiovascular Disease

## 2023-09-24 ENCOUNTER — Ambulatory Visit: Payer: Medicare Other | Attending: Internal Medicine | Admitting: Internal Medicine

## 2023-09-24 ENCOUNTER — Encounter: Payer: Self-pay | Admitting: Internal Medicine

## 2023-09-24 VITALS — BP 134/80 | HR 61 | Ht 68.0 in | Wt 179.2 lb

## 2023-09-24 DIAGNOSIS — Z01810 Encounter for preprocedural cardiovascular examination: Secondary | ICD-10-CM

## 2023-09-24 DIAGNOSIS — T466X5D Adverse effect of antihyperlipidemic and antiarteriosclerotic drugs, subsequent encounter: Secondary | ICD-10-CM | POA: Diagnosis not present

## 2023-09-24 DIAGNOSIS — E785 Hyperlipidemia, unspecified: Secondary | ICD-10-CM

## 2023-09-24 DIAGNOSIS — I252 Old myocardial infarction: Secondary | ICD-10-CM

## 2023-09-24 DIAGNOSIS — I10 Essential (primary) hypertension: Secondary | ICD-10-CM

## 2023-09-24 DIAGNOSIS — M791 Myalgia, unspecified site: Secondary | ICD-10-CM | POA: Diagnosis not present

## 2023-09-24 NOTE — Progress Notes (Signed)
 LIPID CLINIC CONSULT NOTE  Chief Complaint:  Follow-up dyslipidemia, preoperative risk assessment  Primary Care Physician: Jearldine Mina, MD  Primary Cardiologist:  Hazle Lites, MD  HPI:  Jerome Duncan is a 78 y.o. male who is being seen today for the evaluation of dyslipidemia at the request of Husain, Karrar, MD.  This is a pleasant 78 year old male with a history of coronary artery disease previously followed by Dr. Felipe Horton at which time he underwent cardiac catheterization in 2000.  He may have had an intervention but denied any coronary stent.  He was followed up clinically after that and then released.  He also was noted to have had an old stroke at some point.  He may have also suffered some mini strokes but underwent whole-body MRI imaging because of cysts along his spine and was noted to have stroke on MRI.  He has a history of dyslipidemia and unfortunately statin intolerance.  He is previously been on atorvastatin, rosuvastatin, niacin and more recently Zyptimag.  His most recent lipid profile showed a total cholesterol 06/28/1934, HDL 44, LDL of 169 and triglycerides 120.  10/11/2020  Jerome Duncan returns today for follow-up of dyslipidemia.  Unfortunately, he was not able to get Repatha  due to cost issues.  He had recently made a change to his insurance provider and saw his PCP who had recommended he come back to the lipid clinic to reevaluate his dyslipidemia.  Labs as of March 2022 showed total cholesterol 245, HDL 46, LDL 181 and triglycerides 045.  Chin he had a history of coronary disease , prior MI and stroke and prior coronary intervention therefore his target LDL is less than 70 and he has been statin intolerant stating memory issues.  Will need least 50% reduction in cholesterol which is most likely achievable with a PCSK9 inhibitor.  01/25/2021  Jerome Duncan returns today for follow-up.  He was able to start on Repatha .  He says he is currently pain about $47 a month.   He tolerates the medicine well.  He has had a marked reduction in his lipids with total cholesterol now 147, triglycerides 178, HDL 43 and LDL 74 that is down from an LDL of 181 in the past.  Triglycerides are little bit higher.  We talked about importance of continue to mind his diet.  Physical activity would be important as well.  Overall the response however has been very good to Repatha .  09/17/2022  Jerome Duncan is seen today in follow-up.  He continues to do well on therapy.  It has been 2 years since I last saw him.  He remains on Repatha .  Total cholesterol now 112, triglycerides 116, HDL 44 and LDL 47.  09/24/2023  Jerome Duncan is seen today in follow-up.  Overall he is done well over the past year.  He is having problems with his left shoulder.  He has had a prior right shoulder surgery and now is hoping to have his left shoulder operated on by Dr. Alfredo Ano.  Today he is here for preoperative clearance.  He also had recent labs looking at his lipids.  Total cholesterol 147, triglycerides 121, HDL 42 and LDL 83.  This is up from 74 however his triglycerides have come down and I suspect this is a calculation wash.  He is on Repatha .  He reports near daily fatigue.  His sleep is erratic.  He does use Tylenol  PM.  Pain may be a factor as to why is not  sleeping well.  I think it is less likely that it is Repatha  however we did discuss holding a dose or 2 of the Repatha  to see if his fatigue improves.  PMHx:  Past Medical History:  Diagnosis Date   Anginal pain (HCC)    Complication of anesthesia    Cyst, bone    L4-L5 spine   Heart attack (HCC)    PONV (postoperative nausea and vomiting)    Stroke [facility])     Past Surgical History:  Procedure Laterality Date   APPENDECTOMY     CARDIAC CATHETERIZATION     HERNIA REPAIR     REVERSE SHOULDER ARTHROPLASTY Right 09/29/2019   Procedure: REVERSE SHOULDER ARTHROPLASTY;  Surgeon: Ellard Gunning, MD;  Location: [facility];  Service: Orthopedics;  Laterality:  Right;    TONSILLECTOMY      FAMHx:  Family History  Problem Relation Age of Onset   Hypertension Mother    CAD Father     SOCHx:   reports that he quit smoking about 27 years ago. His smoking use included cigarettes. He started smoking about 42 years ago. He has a 30 pack-year smoking history. He has never used smokeless tobacco. He reports that he does not drink alcohol and does not use drugs.  ALLERGIES:  Allergies  Allergen Reactions   Sulfa Antibiotics Nausea Only   Erythromycin Palpitations    ROS: Pertinent items noted in HPI and remainder of comprehensive ROS otherwise negative.  HOME MEDS: Current Outpatient Medications on File Prior to Visit  Medication Sig Dispense Refill   acetaminophen  (TYLENOL ) 500 MG tablet Take 500-1,000 mg by mouth every 6 (six) hours as needed (pain.).     cyclobenzaprine  (FLEXERIL ) 5 MG tablet Take 1 tablet (5 mg total) by mouth 3 (three) times daily as needed for muscle spasms. 30 tablet 0   diphenhydramine -acetaminophen  (TYLENOL  PM) 25-500 MG TABS tablet Take 1 tablet by mouth at bedtime.     metoprolol succinate (TOPROL-XL) 50 MG 24 hr tablet Take 25 mg by mouth daily.     REPATHA  SURECLICK 140 MG/ML SOAJ INJECT 140MG  SUBCUTANEOUSLY  EVERY 2 WEEKS 6 mL 3   No current facility-administered medications on file prior to visit.    LABS/IMAGING: No results found for this or any previous visit (from the past 48 hours). No results found.  LIPID PANEL:    Component Value Date/Time   CHOL 147 09/03/2023 0819   TRIG 121 09/03/2023 0819   HDL 42 09/03/2023 0819   CHOLHDL 3.5 09/03/2023 0819   LDLCALC 83 09/03/2023 0819    WEIGHTS: Wt Readings from Last 3 Encounters:  09/24/23 179 lb 3.2 oz (81.3 kg)  09/17/22 179 lb 9.6 oz (81.5 kg)  01/25/21 182 lb (82.6 kg)    VITALS: BP 134/80 (BP Location: Left Arm, Patient Position: Sitting, Cuff Size: Normal)   Pulse 61   Ht 5\' 8"  (1.727 m)   Wt 179 lb 3.2 oz (81.3 kg)   SpO2 94%    BMI 27.25 kg/m   EXAM: General appearance: alert and no distress Neck: no carotid bruit, no JVD, and thyroid  not enlarged, symmetric, no tenderness/mass/nodules Lungs: clear to auscultation bilaterally Heart: regular rate and rhythm, S1, S2 normal, no murmur, click, rub or gallop Extremities: extremities normal, atraumatic, no cyanosis or edema Skin: Skin color, texture, turgor normal. No rashes or lesions Neurologic: Grossly normal Psych: Pleasant  EKG: EKG Interpretation Date/Time:  Tuesday September 24 2023 09:48:51 EDT Ventricular Rate:  61 PR Interval:  168 QRS Duration:  94 QT Interval:  396 QTC Calculation: 398 R Axis:   -26  Text Interpretation: Normal sinus rhythm No significant change since last tracing No significant change was found Confirmed by Dinah Franco [phone]) on 09/24/2023 9:54:53 AM   ASSESSMENT: Mixed dyslipidemia, goal LDL less than 70 Statin intolerance-memory loss, myalgias History of coronary artery disease with MI in the 1990s History of subclinical stroke Hypertension  PLAN: 1.   Jerome Duncan seems to be doing well without chest pain or shortness of breath.  He is at acceptable risk for upcoming left shoulder surgery.  He is not on any blood thinners or anything that needs to be held.  He has been having some fatigue.  I think sleep has been an issue for him.  He thinks it could be the Repatha  which is less likely in my opinion however he is okay to hold 1 or 2 doses of that and report back to me to see if his fatigue improves.  Lipids seem pretty well-controlled.  Will plan otherwise follow-up annually or sooner as necessary.  Hazle Lites, MD, [facility], FNLA, FACP  [place]  [facility]  Medical Director of the Advanced Lipid Disorders &  Cardiovascular Risk Reduction Clinic Diplomate of the American Board of Clinical Lipidology Attending Cardiologist  Direct Dial: [phone]  Fax: [phone]  Website:  [email or web address] 09/24/2023, 9:58 AM

## 2023-09-24 NOTE — Patient Instructions (Addendum)
 Medication Instructions:  Your physician recommends that you continue on your current medications as directed. Please refer to the Current Medication list given to you today.  *If you need a refill on your cardiac medications before your next appointment, please call your pharmacy*  Lab Work: FASTING Lipid Panel April 2026 (Prior to 1 year follow up appt) If you have labs (blood work) drawn today and your tests are completely normal, you will receive your results only by: MyChart Message (if you have MyChart) OR A paper copy in the mail If you have any lab test that is abnormal or we need to change your treatment, we will call you to review the results.   Follow-Up: At [facility], you and your health needs are our priority.  As part of our continuing mission to provide you with exceptional heart care, our providers are all part of one team.  This team includes your primary Cardiologist (physician) and Advanced Practice Providers or APPs (Physician Assistants and Nurse Practitioners) who all work together to provide you with the care you need, when you need it.  Your next appointment:   12 month(s)  Provider:   Hazle Lites, MD

## 2023-10-01 DIAGNOSIS — M19012 Primary osteoarthritis, left shoulder: Secondary | ICD-10-CM | POA: Diagnosis not present

## 2023-10-01 DIAGNOSIS — G8918 Other acute postprocedural pain: Secondary | ICD-10-CM | POA: Diagnosis not present

## 2023-10-16 DIAGNOSIS — Z96612 Presence of left artificial shoulder joint: Secondary | ICD-10-CM | POA: Diagnosis not present

## 2023-11-04 DIAGNOSIS — M25512 Pain in left shoulder: Secondary | ICD-10-CM | POA: Diagnosis not present

## 2023-11-22 DIAGNOSIS — G720 Drug-induced myopathy: Secondary | ICD-10-CM | POA: Diagnosis not present

## 2023-11-22 DIAGNOSIS — Z8673 Personal history of transient ischemic attack (TIA), and cerebral infarction without residual deficits: Secondary | ICD-10-CM | POA: Diagnosis not present

## 2023-11-22 DIAGNOSIS — R7303 Prediabetes: Secondary | ICD-10-CM | POA: Diagnosis not present

## 2023-11-22 DIAGNOSIS — Z1389 Encounter for screening for other disorder: Secondary | ICD-10-CM | POA: Diagnosis not present

## 2023-11-22 DIAGNOSIS — Z0000 Encounter for general adult medical examination without abnormal findings: Secondary | ICD-10-CM | POA: Diagnosis not present

## 2023-11-22 DIAGNOSIS — E782 Mixed hyperlipidemia: Secondary | ICD-10-CM | POA: Diagnosis not present

## 2023-11-22 DIAGNOSIS — Z23 Encounter for immunization: Secondary | ICD-10-CM | POA: Diagnosis not present

## 2023-11-22 DIAGNOSIS — I10 Essential (primary) hypertension: Secondary | ICD-10-CM | POA: Diagnosis not present

## 2023-11-22 DIAGNOSIS — I2510 Atherosclerotic heart disease of native coronary artery without angina pectoris: Secondary | ICD-10-CM | POA: Diagnosis not present

## 2023-11-22 DIAGNOSIS — C4490 Unspecified malignant neoplasm of skin, unspecified: Secondary | ICD-10-CM | POA: Diagnosis not present

## 2023-11-22 DIAGNOSIS — M48061 Spinal stenosis, lumbar region without neurogenic claudication: Secondary | ICD-10-CM | POA: Diagnosis not present

## 2023-11-22 LAB — LAB REPORT - SCANNED
A1c: 6.6
EGFR: 89
TSH: 1.77 (ref 0.41–5.90)

## 2023-11-25 DIAGNOSIS — E782 Mixed hyperlipidemia: Secondary | ICD-10-CM | POA: Diagnosis not present

## 2023-11-25 DIAGNOSIS — M47816 Spondylosis without myelopathy or radiculopathy, lumbar region: Secondary | ICD-10-CM | POA: Diagnosis not present

## 2023-11-25 DIAGNOSIS — I2510 Atherosclerotic heart disease of native coronary artery without angina pectoris: Secondary | ICD-10-CM | POA: Diagnosis not present

## 2023-11-25 DIAGNOSIS — I10 Essential (primary) hypertension: Secondary | ICD-10-CM | POA: Diagnosis not present

## 2023-12-09 ENCOUNTER — Encounter: Payer: Self-pay | Admitting: Internal Medicine

## 2023-12-26 DIAGNOSIS — M47816 Spondylosis without myelopathy or radiculopathy, lumbar region: Secondary | ICD-10-CM | POA: Diagnosis not present

## 2023-12-26 DIAGNOSIS — E782 Mixed hyperlipidemia: Secondary | ICD-10-CM | POA: Diagnosis not present

## 2023-12-26 DIAGNOSIS — I2510 Atherosclerotic heart disease of native coronary artery without angina pectoris: Secondary | ICD-10-CM | POA: Diagnosis not present

## 2023-12-26 DIAGNOSIS — I10 Essential (primary) hypertension: Secondary | ICD-10-CM | POA: Diagnosis not present

## 2024-01-26 DIAGNOSIS — M47816 Spondylosis without myelopathy or radiculopathy, lumbar region: Secondary | ICD-10-CM | POA: Diagnosis not present

## 2024-01-26 DIAGNOSIS — I10 Essential (primary) hypertension: Secondary | ICD-10-CM | POA: Diagnosis not present

## 2024-01-26 DIAGNOSIS — E782 Mixed hyperlipidemia: Secondary | ICD-10-CM | POA: Diagnosis not present

## 2024-01-26 DIAGNOSIS — I2510 Atherosclerotic heart disease of native coronary artery without angina pectoris: Secondary | ICD-10-CM | POA: Diagnosis not present

## 2024-01-28 DIAGNOSIS — L570 Actinic keratosis: Secondary | ICD-10-CM | POA: Diagnosis not present

## 2024-01-28 DIAGNOSIS — D0439 Carcinoma in situ of skin of other parts of face: Secondary | ICD-10-CM | POA: Diagnosis not present

## 2024-02-21 DIAGNOSIS — R42 Dizziness and giddiness: Secondary | ICD-10-CM | POA: Diagnosis not present

## 2024-02-25 DIAGNOSIS — E782 Mixed hyperlipidemia: Secondary | ICD-10-CM | POA: Diagnosis not present

## 2024-02-25 DIAGNOSIS — M47816 Spondylosis without myelopathy or radiculopathy, lumbar region: Secondary | ICD-10-CM | POA: Diagnosis not present

## 2024-02-25 DIAGNOSIS — I10 Essential (primary) hypertension: Secondary | ICD-10-CM | POA: Diagnosis not present

## 2024-02-25 DIAGNOSIS — I2510 Atherosclerotic heart disease of native coronary artery without angina pectoris: Secondary | ICD-10-CM | POA: Diagnosis not present

## 2024-03-06 DIAGNOSIS — H814 Vertigo of central origin: Secondary | ICD-10-CM | POA: Diagnosis not present

## 2024-03-06 DIAGNOSIS — H8113 Benign paroxysmal vertigo, bilateral: Secondary | ICD-10-CM | POA: Diagnosis not present

## 2024-03-10 DIAGNOSIS — H814 Vertigo of central origin: Secondary | ICD-10-CM | POA: Diagnosis not present

## 2024-03-10 DIAGNOSIS — H8113 Benign paroxysmal vertigo, bilateral: Secondary | ICD-10-CM | POA: Diagnosis not present

## 2024-03-12 DIAGNOSIS — H814 Vertigo of central origin: Secondary | ICD-10-CM | POA: Diagnosis not present

## 2024-03-12 DIAGNOSIS — H8113 Benign paroxysmal vertigo, bilateral: Secondary | ICD-10-CM | POA: Diagnosis not present

## 2024-03-16 DIAGNOSIS — H8113 Benign paroxysmal vertigo, bilateral: Secondary | ICD-10-CM | POA: Diagnosis not present

## 2024-03-16 DIAGNOSIS — H814 Vertigo of central origin: Secondary | ICD-10-CM | POA: Diagnosis not present

## 2024-03-18 DIAGNOSIS — H8113 Benign paroxysmal vertigo, bilateral: Secondary | ICD-10-CM | POA: Diagnosis not present

## 2024-03-18 DIAGNOSIS — H814 Vertigo of central origin: Secondary | ICD-10-CM | POA: Diagnosis not present

## 2024-03-25 DIAGNOSIS — H814 Vertigo of central origin: Secondary | ICD-10-CM | POA: Diagnosis not present

## 2024-03-25 DIAGNOSIS — H8113 Benign paroxysmal vertigo, bilateral: Secondary | ICD-10-CM | POA: Diagnosis not present

## 2024-03-26 ENCOUNTER — Other Ambulatory Visit: Payer: Self-pay | Admitting: Internal Medicine
